# Patient Record
Sex: Female | Born: 1955 | Race: Black or African American | Hispanic: No | Marital: Single | Smoking: Never smoker
Health system: Southern US, Community
[De-identification: ages and names within clinical notes are randomized; demographics above are authoritative.]

## PROBLEM LIST (undated history)

## (undated) DIAGNOSIS — IMO0002 Reserved for concepts with insufficient information to code with codable children: Secondary | ICD-10-CM

## (undated) DIAGNOSIS — N72 Inflammatory disease of cervix uteri: Secondary | ICD-10-CM

## (undated) DIAGNOSIS — K635 Polyp of colon: Secondary | ICD-10-CM

## (undated) DIAGNOSIS — E039 Hypothyroidism, unspecified: Secondary | ICD-10-CM

## (undated) DIAGNOSIS — N809 Endometriosis, unspecified: Secondary | ICD-10-CM

## (undated) DIAGNOSIS — B9689 Other specified bacterial agents as the cause of diseases classified elsewhere: Secondary | ICD-10-CM

## (undated) DIAGNOSIS — H409 Unspecified glaucoma: Secondary | ICD-10-CM

## (undated) DIAGNOSIS — N760 Acute vaginitis: Secondary | ICD-10-CM

## (undated) DIAGNOSIS — R87619 Unspecified abnormal cytological findings in specimens from cervix uteri: Secondary | ICD-10-CM

## (undated) DIAGNOSIS — N762 Acute vulvitis: Secondary | ICD-10-CM

## (undated) DIAGNOSIS — T7840XA Allergy, unspecified, initial encounter: Secondary | ICD-10-CM

## (undated) DIAGNOSIS — I10 Essential (primary) hypertension: Secondary | ICD-10-CM

## (undated) DIAGNOSIS — D219 Benign neoplasm of connective and other soft tissue, unspecified: Secondary | ICD-10-CM

## (undated) DIAGNOSIS — D649 Anemia, unspecified: Secondary | ICD-10-CM

## (undated) HISTORY — DX: Unspecified abnormal cytological findings in specimens from cervix uteri: R87.619

## (undated) HISTORY — DX: Endometriosis, unspecified: N80.9

## (undated) HISTORY — DX: Essential (primary) hypertension: I10

## (undated) HISTORY — DX: Hypothyroidism, unspecified: E03.9

## (undated) HISTORY — PX: WISDOM TOOTH EXTRACTION: SHX21

## (undated) HISTORY — DX: Other specified bacterial agents as the cause of diseases classified elsewhere: B96.89

## (undated) HISTORY — DX: Anemia, unspecified: D64.9

## (undated) HISTORY — DX: Inflammatory disease of cervix uteri: N72

## (undated) HISTORY — DX: Unspecified glaucoma: H40.9

## (undated) HISTORY — DX: Reserved for concepts with insufficient information to code with codable children: IMO0002

## (undated) HISTORY — DX: Benign neoplasm of connective and other soft tissue, unspecified: D21.9

## (undated) HISTORY — DX: Polyp of colon: K63.5

## (undated) HISTORY — PX: LAPAROSCOPY: SHX197

## (undated) HISTORY — DX: Allergy, unspecified, initial encounter: T78.40XA

## (undated) HISTORY — PX: OTHER SURGICAL HISTORY: SHX169

## (undated) HISTORY — DX: Other specified bacterial agents as the cause of diseases classified elsewhere: N76.0

## (undated) HISTORY — DX: Acute vulvitis: N76.2

---

## 1987-05-27 HISTORY — PX: LAPAROSCOPY: SHX197

## 1997-10-25 ENCOUNTER — Ambulatory Visit (HOSPITAL_COMMUNITY): Admission: RE | Admit: 1997-10-25 | Discharge: 1997-10-25 | Payer: Self-pay | Admitting: Obstetrics and Gynecology

## 1997-12-20 ENCOUNTER — Other Ambulatory Visit: Admission: RE | Admit: 1997-12-20 | Discharge: 1997-12-20 | Payer: Self-pay | Admitting: Obstetrics & Gynecology

## 1998-05-26 HISTORY — PX: GLAUCOMA SURGERY: SHX656

## 1998-10-16 ENCOUNTER — Other Ambulatory Visit: Admission: RE | Admit: 1998-10-16 | Discharge: 1998-10-16 | Payer: Self-pay | Admitting: Obstetrics and Gynecology

## 1998-10-30 ENCOUNTER — Ambulatory Visit (HOSPITAL_COMMUNITY): Admission: RE | Admit: 1998-10-30 | Discharge: 1998-10-30 | Payer: Self-pay | Admitting: Obstetrics and Gynecology

## 1998-10-30 ENCOUNTER — Encounter: Payer: Self-pay | Admitting: Obstetrics and Gynecology

## 1998-11-14 ENCOUNTER — Ambulatory Visit (HOSPITAL_COMMUNITY): Admission: RE | Admit: 1998-11-14 | Discharge: 1998-11-14 | Payer: Self-pay | Admitting: Family Medicine

## 1998-12-25 ENCOUNTER — Ambulatory Visit (HOSPITAL_COMMUNITY): Admission: RE | Admit: 1998-12-25 | Discharge: 1998-12-25 | Payer: Self-pay | Admitting: Cardiology

## 1999-11-06 ENCOUNTER — Other Ambulatory Visit: Admission: RE | Admit: 1999-11-06 | Discharge: 1999-11-06 | Payer: Self-pay | Admitting: Obstetrics and Gynecology

## 1999-11-11 ENCOUNTER — Encounter: Payer: Self-pay | Admitting: Obstetrics and Gynecology

## 1999-11-11 ENCOUNTER — Ambulatory Visit (HOSPITAL_COMMUNITY): Admission: RE | Admit: 1999-11-11 | Discharge: 1999-11-11 | Payer: Self-pay | Admitting: Obstetrics and Gynecology

## 2000-03-03 ENCOUNTER — Encounter (INDEPENDENT_AMBULATORY_CARE_PROVIDER_SITE_OTHER): Payer: Self-pay | Admitting: Specialist

## 2000-03-03 ENCOUNTER — Other Ambulatory Visit: Admission: RE | Admit: 2000-03-03 | Discharge: 2000-03-03 | Payer: Self-pay | Admitting: Ophthalmology

## 2000-11-02 ENCOUNTER — Ambulatory Visit (HOSPITAL_COMMUNITY): Admission: RE | Admit: 2000-11-02 | Discharge: 2000-11-02 | Payer: Self-pay | Admitting: Obstetrics and Gynecology

## 2000-11-02 ENCOUNTER — Encounter: Payer: Self-pay | Admitting: Obstetrics and Gynecology

## 2000-12-08 ENCOUNTER — Encounter: Admission: RE | Admit: 2000-12-08 | Discharge: 2000-12-08 | Payer: Self-pay | Admitting: Family Medicine

## 2000-12-08 ENCOUNTER — Encounter: Payer: Self-pay | Admitting: Family Medicine

## 2000-12-28 ENCOUNTER — Other Ambulatory Visit: Admission: RE | Admit: 2000-12-28 | Discharge: 2000-12-28 | Payer: Self-pay | Admitting: Obstetrics and Gynecology

## 2001-11-03 ENCOUNTER — Ambulatory Visit (HOSPITAL_COMMUNITY): Admission: RE | Admit: 2001-11-03 | Discharge: 2001-11-03 | Payer: Self-pay | Admitting: Obstetrics and Gynecology

## 2001-11-03 ENCOUNTER — Encounter: Payer: Self-pay | Admitting: Obstetrics and Gynecology

## 2002-01-17 ENCOUNTER — Other Ambulatory Visit: Admission: RE | Admit: 2002-01-17 | Discharge: 2002-01-17 | Payer: Self-pay | Admitting: Obstetrics and Gynecology

## 2002-11-07 ENCOUNTER — Ambulatory Visit (HOSPITAL_COMMUNITY): Admission: RE | Admit: 2002-11-07 | Discharge: 2002-11-07 | Payer: Self-pay | Admitting: Obstetrics and Gynecology

## 2002-11-07 ENCOUNTER — Encounter: Payer: Self-pay | Admitting: Obstetrics and Gynecology

## 2003-01-05 ENCOUNTER — Other Ambulatory Visit: Admission: RE | Admit: 2003-01-05 | Discharge: 2003-01-05 | Payer: Self-pay | Admitting: Obstetrics and Gynecology

## 2003-11-29 ENCOUNTER — Ambulatory Visit (HOSPITAL_COMMUNITY): Admission: RE | Admit: 2003-11-29 | Discharge: 2003-11-29 | Payer: Self-pay | Admitting: Obstetrics and Gynecology

## 2004-02-12 ENCOUNTER — Other Ambulatory Visit: Admission: RE | Admit: 2004-02-12 | Discharge: 2004-02-12 | Payer: Self-pay | Admitting: Obstetrics and Gynecology

## 2004-02-21 ENCOUNTER — Ambulatory Visit (HOSPITAL_COMMUNITY): Admission: RE | Admit: 2004-02-21 | Discharge: 2004-02-21 | Payer: Self-pay | Admitting: Obstetrics and Gynecology

## 2004-03-29 ENCOUNTER — Encounter: Admission: RE | Admit: 2004-03-29 | Discharge: 2004-03-29 | Payer: Self-pay | Admitting: Obstetrics and Gynecology

## 2004-12-05 ENCOUNTER — Ambulatory Visit (HOSPITAL_COMMUNITY): Admission: RE | Admit: 2004-12-05 | Discharge: 2004-12-05 | Payer: Self-pay | Admitting: Obstetrics and Gynecology

## 2005-01-08 ENCOUNTER — Other Ambulatory Visit: Admission: RE | Admit: 2005-01-08 | Discharge: 2005-01-08 | Payer: Self-pay | Admitting: Obstetrics and Gynecology

## 2005-06-23 ENCOUNTER — Other Ambulatory Visit: Admission: RE | Admit: 2005-06-23 | Discharge: 2005-06-23 | Payer: Self-pay | Admitting: Obstetrics and Gynecology

## 2005-12-16 ENCOUNTER — Ambulatory Visit (HOSPITAL_COMMUNITY): Admission: RE | Admit: 2005-12-16 | Discharge: 2005-12-16 | Payer: Self-pay | Admitting: Obstetrics and Gynecology

## 2005-12-24 ENCOUNTER — Other Ambulatory Visit: Admission: RE | Admit: 2005-12-24 | Discharge: 2005-12-24 | Payer: Self-pay | Admitting: Obstetrics and Gynecology

## 2006-12-28 ENCOUNTER — Ambulatory Visit (HOSPITAL_COMMUNITY): Admission: RE | Admit: 2006-12-28 | Discharge: 2006-12-28 | Payer: Self-pay | Admitting: Obstetrics and Gynecology

## 2008-01-07 ENCOUNTER — Ambulatory Visit (HOSPITAL_COMMUNITY): Admission: RE | Admit: 2008-01-07 | Discharge: 2008-01-07 | Payer: Self-pay | Admitting: Obstetrics and Gynecology

## 2009-01-08 ENCOUNTER — Encounter (HOSPITAL_COMMUNITY): Admission: RE | Admit: 2009-01-08 | Discharge: 2009-03-30 | Payer: Self-pay | Admitting: Family Medicine

## 2009-02-06 ENCOUNTER — Ambulatory Visit (HOSPITAL_COMMUNITY): Admission: RE | Admit: 2009-02-06 | Discharge: 2009-02-06 | Payer: Self-pay | Admitting: Family Medicine

## 2009-12-17 ENCOUNTER — Encounter (HOSPITAL_COMMUNITY): Admission: RE | Admit: 2009-12-17 | Discharge: 2010-02-20 | Payer: Self-pay | Admitting: Endocrinology

## 2009-12-25 ENCOUNTER — Ambulatory Visit (HOSPITAL_COMMUNITY): Admission: RE | Admit: 2009-12-25 | Discharge: 2009-12-25 | Payer: Self-pay | Admitting: Endocrinology

## 2010-03-07 ENCOUNTER — Ambulatory Visit (HOSPITAL_COMMUNITY): Admission: RE | Admit: 2010-03-07 | Discharge: 2010-03-07 | Payer: Self-pay | Admitting: Obstetrics and Gynecology

## 2010-05-26 HISTORY — PX: WISDOM TOOTH EXTRACTION: SHX21

## 2010-06-16 ENCOUNTER — Encounter: Payer: Self-pay | Admitting: Obstetrics and Gynecology

## 2010-06-16 ENCOUNTER — Encounter: Payer: Self-pay | Admitting: Endocrinology

## 2010-06-17 ENCOUNTER — Encounter: Payer: Self-pay | Admitting: Family Medicine

## 2011-03-06 ENCOUNTER — Other Ambulatory Visit (HOSPITAL_COMMUNITY): Payer: Self-pay | Admitting: Obstetrics and Gynecology

## 2011-03-06 DIAGNOSIS — Z1231 Encounter for screening mammogram for malignant neoplasm of breast: Secondary | ICD-10-CM

## 2011-03-20 ENCOUNTER — Ambulatory Visit (HOSPITAL_COMMUNITY)
Admission: RE | Admit: 2011-03-20 | Discharge: 2011-03-20 | Disposition: A | Discharge status: Home / Self Care | Payer: Managed Care, Other (non HMO) | Source: Ambulatory Visit | Attending: Obstetrics and Gynecology | Admitting: Obstetrics and Gynecology

## 2011-03-20 DIAGNOSIS — Z1231 Encounter for screening mammogram for malignant neoplasm of breast: Secondary | ICD-10-CM

## 2011-12-26 DIAGNOSIS — N809 Endometriosis, unspecified: Secondary | ICD-10-CM | POA: Insufficient documentation

## 2011-12-26 DIAGNOSIS — IMO0002 Reserved for concepts with insufficient information to code with codable children: Secondary | ICD-10-CM | POA: Insufficient documentation

## 2011-12-26 DIAGNOSIS — E039 Hypothyroidism, unspecified: Secondary | ICD-10-CM | POA: Insufficient documentation

## 2011-12-26 DIAGNOSIS — I10 Essential (primary) hypertension: Secondary | ICD-10-CM | POA: Insufficient documentation

## 2011-12-26 DIAGNOSIS — D649 Anemia, unspecified: Secondary | ICD-10-CM

## 2011-12-26 DIAGNOSIS — B9689 Other specified bacterial agents as the cause of diseases classified elsewhere: Secondary | ICD-10-CM

## 2011-12-26 DIAGNOSIS — N762 Acute vulvitis: Secondary | ICD-10-CM | POA: Insufficient documentation

## 2011-12-26 DIAGNOSIS — H409 Unspecified glaucoma: Secondary | ICD-10-CM

## 2011-12-26 DIAGNOSIS — N760 Acute vaginitis: Secondary | ICD-10-CM

## 2011-12-26 DIAGNOSIS — K635 Polyp of colon: Secondary | ICD-10-CM

## 2011-12-26 DIAGNOSIS — D219 Benign neoplasm of connective and other soft tissue, unspecified: Secondary | ICD-10-CM | POA: Insufficient documentation

## 2011-12-26 DIAGNOSIS — N72 Inflammatory disease of cervix uteri: Secondary | ICD-10-CM | POA: Insufficient documentation

## 2011-12-28 IMAGING — NM NM RAI THERAPY FOR HYPERTHYROIDISM
1 series · 1 of 1 positions shown · non-contrast
Comparison: Thyroid uptake and scan 12/18/2009, 01/09/2009

CLINICAL DATA: Graves' disease.  Second treatment or residual
thyroid toxicosis.  Initial therapy of 15 mCi on 01/09/2009.

NUCLEAR MEDICINE RADIOACTIVE IODINE THERAPY FOR HYPERTHYROIDISM
TECHNIQUE: The risks and benefits of radioactive iodine therapy
were discussed with the patient in detail. Alternative therapies
were also mentioned. Radiation safety was discussed with the
patient, including how to protect the general public from exposure.
There were no barriers to communication.  Written consent was
obtained.  The patient then received a capsule containing the
radiopharmaceutical.  The patient will follow-up with the referring
physician.
Radiopharmaceutical: 25.8 mCi I 131

[Series 1: st static · 2.37mm/px · 1 of 1 slices shown]
[im 1/1]
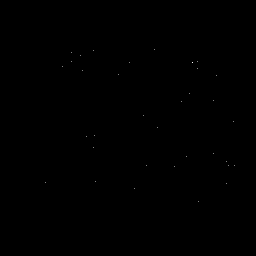

[1 of 1 positions shown; findings below may reference images not displayed]

FINDINGS: Imaging findings and lab values consistent with Graves'
disease.
IMPRESSION: Per oral  I 131 therapy for Graves' disease.

## 2011-12-30 ENCOUNTER — Ambulatory Visit: Payer: Self-pay | Admitting: Obstetrics and Gynecology

## 2012-02-12 ENCOUNTER — Other Ambulatory Visit: Payer: Self-pay | Admitting: Obstetrics and Gynecology

## 2012-02-12 DIAGNOSIS — Z1231 Encounter for screening mammogram for malignant neoplasm of breast: Secondary | ICD-10-CM

## 2012-03-23 ENCOUNTER — Ambulatory Visit (HOSPITAL_COMMUNITY): Payer: Managed Care, Other (non HMO)

## 2012-04-06 ENCOUNTER — Ambulatory Visit (HOSPITAL_COMMUNITY)
Admission: RE | Admit: 2012-04-06 | Discharge: 2012-04-06 | Disposition: A | Discharge status: Home / Self Care | Payer: Managed Care, Other (non HMO) | Source: Ambulatory Visit | Attending: Obstetrics and Gynecology | Admitting: Obstetrics and Gynecology

## 2012-04-06 DIAGNOSIS — Z1231 Encounter for screening mammogram for malignant neoplasm of breast: Secondary | ICD-10-CM | POA: Insufficient documentation

## 2012-06-24 ENCOUNTER — Ambulatory Visit: Payer: BC Managed Care – PPO | Admitting: Obstetrics and Gynecology

## 2012-06-24 ENCOUNTER — Encounter: Payer: Self-pay | Admitting: Obstetrics and Gynecology

## 2012-06-24 VITALS — BP 122/82 | Ht 68.0 in | Wt 225.0 lb

## 2012-06-24 DIAGNOSIS — D649 Anemia, unspecified: Secondary | ICD-10-CM

## 2012-06-24 DIAGNOSIS — D219 Benign neoplasm of connective and other soft tissue, unspecified: Secondary | ICD-10-CM

## 2012-06-24 DIAGNOSIS — N72 Inflammatory disease of cervix uteri: Secondary | ICD-10-CM

## 2012-06-24 DIAGNOSIS — Z124 Encounter for screening for malignant neoplasm of cervix: Secondary | ICD-10-CM

## 2012-06-24 NOTE — Progress Notes (Signed)
AEX Every  Last Pap: 02/2011 WNL: No Regular Periods:no Contraception: pm  Monthly Breast exam:yes Tetanus<46yrs:yes Nl.Bladder Function:yes Daily BMs:yes Healthy Diet:yes Calcium:yes Mammogram:yes Date of Mammogram: 03/2012 Exercise:yes Have often Exercise: 2 times a week  Seatbelt: yes Abuse at home: no Stressful work:no Sigmoid-colonoscopy: 04/2008 Bone Density: Yes at work  PCP: Dr.Sanders  Change in PMH: unchanged  Change in ZOX:[id number]  Pt stated no issues today.  Subjective:    Julia Richards is a 57 y.o. female G0P0000 who presents for annual exam.  The patient has no complaints today. She had a long history of fibroids, and presumed endometriosis but has no symptoms referable to either of those at this time. She has had no vaginal bleeding.  The following portions of the patient's history were reviewed and updated as appropriate: allergies, current medications, past family history, past medical history, past social history, past surgical history and problem list.  Review of Systems Pertinent items are noted in HPI. Gastrointestinal:No change in bowel habits, no abdominal pain, no rectal bleeding Genitourinary:negative for dysuria, frequency, hematuria, nocturia and urinary incontinence    Objective:     BP 122/82  Ht 5\' 8"  (1.727 m)  Wt 225 lb (102.059 kg)  BMI 34.21 kg/m2  Weight:  Wt Readings from Last 1 Encounters:  06/24/12 225 lb (102.059 kg)     BMI: Body mass index is 34.21 kg/(m^2). General Appearance: Alert, appropriate appearance for age. No acute distress HEENT: Grossly normal Neck / Thyroid: Supple, no masses, nodes or enlargement Lungs: clear to auscultation bilaterally Back: No CVA tenderness Breast Exam: No masses or nodes.No dimpling, nipple retraction or discharge. Cardiovascular: Regular rate and rhythm. S1, S2, no murmur Gastrointestinal: Soft, non-tender, no masses or organomegaly Pelvic Exam: External genitalia: Spotting  hypopigmentation surrounding fold. This has been stable. Over many years. Vaginal: atrophic mucosa Cervix: normal appearance and Exocervical contact bleeding Adnexa: No masses, or tenderness Uterus: Does not feel enlarged, but he is fairly immobile, and he has been for many years Rectovaginal: normal rectal, no masses Lymphatic Exam: Non-palpable nodes in neck, clavicular, axillary, or inguinal regions Skin: no rash or abnormalities Neurologic: Normal gait and speech, no tremor  Psychiatric: Alert and oriented, appropriate affect.    Urinalysis:Not done    Assessment:    Asymptomatic uterine fibroids  Long history of presumed endometriosis with past history of ureteral obstruction treated conservatively with ureteral, no evidence of recurrent obstruction. Stable hypopigmented vulvar lesion, most consistent with lichen sclerosus which is asymptomatic   Plan:   mammogram pap smear return annually or prn

## 2012-06-25 LAB — PAP IG AND HPV HIGH-RISK: HPV DNA High Risk: NOT DETECTED

## 2012-06-28 ENCOUNTER — Encounter: Payer: Self-pay | Admitting: Obstetrics and Gynecology

## 2013-03-15 ENCOUNTER — Other Ambulatory Visit (HOSPITAL_COMMUNITY): Payer: Self-pay | Admitting: Internal Medicine

## 2013-03-15 DIAGNOSIS — Z1231 Encounter for screening mammogram for malignant neoplasm of breast: Secondary | ICD-10-CM

## 2013-04-11 ENCOUNTER — Ambulatory Visit (HOSPITAL_COMMUNITY)
Admission: RE | Admit: 2013-04-11 | Discharge: 2013-04-11 | Disposition: A | Discharge status: Home / Self Care | Payer: BC Managed Care – PPO | Source: Ambulatory Visit | Attending: Internal Medicine | Admitting: Internal Medicine

## 2013-04-11 DIAGNOSIS — Z1231 Encounter for screening mammogram for malignant neoplasm of breast: Secondary | ICD-10-CM | POA: Insufficient documentation

## 2014-03-14 ENCOUNTER — Other Ambulatory Visit (HOSPITAL_COMMUNITY): Payer: Self-pay | Admitting: Obstetrics and Gynecology

## 2014-03-14 DIAGNOSIS — Z1231 Encounter for screening mammogram for malignant neoplasm of breast: Secondary | ICD-10-CM

## 2014-04-13 ENCOUNTER — Ambulatory Visit (HOSPITAL_COMMUNITY)
Admission: RE | Admit: 2014-04-13 | Discharge: 2014-04-13 | Disposition: A | Discharge status: Home / Self Care | Payer: BC Managed Care – PPO | Source: Ambulatory Visit | Attending: Obstetrics and Gynecology | Admitting: Obstetrics and Gynecology

## 2014-04-13 DIAGNOSIS — Z1231 Encounter for screening mammogram for malignant neoplasm of breast: Secondary | ICD-10-CM | POA: Insufficient documentation

## 2015-03-28 ENCOUNTER — Other Ambulatory Visit: Payer: Self-pay

## 2015-03-28 DIAGNOSIS — Z1231 Encounter for screening mammogram for malignant neoplasm of breast: Secondary | ICD-10-CM

## 2015-05-01 ENCOUNTER — Ambulatory Visit
Admission: RE | Admit: 2015-05-01 | Discharge: 2015-05-01 | Disposition: A | Discharge status: Home / Self Care | Payer: BLUE CROSS/BLUE SHIELD | Source: Ambulatory Visit

## 2015-05-01 DIAGNOSIS — Z1231 Encounter for screening mammogram for malignant neoplasm of breast: Secondary | ICD-10-CM

## 2016-06-10 DIAGNOSIS — Z1231 Encounter for screening mammogram for malignant neoplasm of breast: Secondary | ICD-10-CM | POA: Diagnosis not present

## 2016-06-16 DIAGNOSIS — H401133 Primary open-angle glaucoma, bilateral, severe stage: Secondary | ICD-10-CM | POA: Diagnosis not present

## 2016-08-11 DIAGNOSIS — H401133 Primary open-angle glaucoma, bilateral, severe stage: Secondary | ICD-10-CM | POA: Diagnosis not present

## 2016-09-12 DIAGNOSIS — Z124 Encounter for screening for malignant neoplasm of cervix: Secondary | ICD-10-CM | POA: Diagnosis not present

## 2016-09-12 DIAGNOSIS — Z01411 Encounter for gynecological examination (general) (routine) with abnormal findings: Secondary | ICD-10-CM | POA: Diagnosis not present

## 2016-11-03 DIAGNOSIS — H401133 Primary open-angle glaucoma, bilateral, severe stage: Secondary | ICD-10-CM | POA: Diagnosis not present

## 2016-12-08 DIAGNOSIS — H401133 Primary open-angle glaucoma, bilateral, severe stage: Secondary | ICD-10-CM | POA: Diagnosis not present

## 2017-02-09 DIAGNOSIS — Z23 Encounter for immunization: Secondary | ICD-10-CM | POA: Diagnosis not present

## 2017-05-28 DIAGNOSIS — E890 Postprocedural hypothyroidism: Secondary | ICD-10-CM | POA: Diagnosis not present

## 2017-06-02 DIAGNOSIS — E890 Postprocedural hypothyroidism: Secondary | ICD-10-CM | POA: Diagnosis not present

## 2017-06-02 DIAGNOSIS — I10 Essential (primary) hypertension: Secondary | ICD-10-CM | POA: Diagnosis not present

## 2017-06-09 DIAGNOSIS — N390 Urinary tract infection, site not specified: Secondary | ICD-10-CM | POA: Diagnosis not present

## 2017-06-09 DIAGNOSIS — Z0000 Encounter for general adult medical examination without abnormal findings: Secondary | ICD-10-CM | POA: Diagnosis not present

## 2017-06-09 DIAGNOSIS — D519 Vitamin B12 deficiency anemia, unspecified: Secondary | ICD-10-CM | POA: Diagnosis not present

## 2017-06-09 DIAGNOSIS — I10 Essential (primary) hypertension: Secondary | ICD-10-CM | POA: Diagnosis not present

## 2017-09-15 DIAGNOSIS — N809 Endometriosis, unspecified: Secondary | ICD-10-CM | POA: Diagnosis not present

## 2017-09-15 DIAGNOSIS — L900 Lichen sclerosus et atrophicus: Secondary | ICD-10-CM | POA: Diagnosis not present

## 2017-09-15 DIAGNOSIS — Z01411 Encounter for gynecological examination (general) (routine) with abnormal findings: Secondary | ICD-10-CM | POA: Diagnosis not present

## 2017-10-09 DIAGNOSIS — Z1231 Encounter for screening mammogram for malignant neoplasm of breast: Secondary | ICD-10-CM | POA: Diagnosis not present

## 2017-11-19 DIAGNOSIS — H401133 Primary open-angle glaucoma, bilateral, severe stage: Secondary | ICD-10-CM | POA: Diagnosis not present

## 2017-12-07 DIAGNOSIS — D519 Vitamin B12 deficiency anemia, unspecified: Secondary | ICD-10-CM | POA: Diagnosis not present

## 2017-12-07 DIAGNOSIS — R7309 Other abnormal glucose: Secondary | ICD-10-CM | POA: Diagnosis not present

## 2017-12-07 DIAGNOSIS — I10 Essential (primary) hypertension: Secondary | ICD-10-CM | POA: Diagnosis not present

## 2018-01-01 DIAGNOSIS — H401133 Primary open-angle glaucoma, bilateral, severe stage: Secondary | ICD-10-CM | POA: Diagnosis not present

## 2018-01-04 DIAGNOSIS — Z0000 Encounter for general adult medical examination without abnormal findings: Secondary | ICD-10-CM | POA: Insufficient documentation

## 2018-01-04 DIAGNOSIS — E890 Postprocedural hypothyroidism: Secondary | ICD-10-CM | POA: Diagnosis not present

## 2018-01-04 DIAGNOSIS — Z79899 Other long term (current) drug therapy: Secondary | ICD-10-CM | POA: Insufficient documentation

## 2018-01-04 DIAGNOSIS — E559 Vitamin D deficiency, unspecified: Secondary | ICD-10-CM | POA: Insufficient documentation

## 2018-01-04 DIAGNOSIS — I10 Essential (primary) hypertension: Secondary | ICD-10-CM | POA: Diagnosis not present

## 2018-01-04 DIAGNOSIS — D519 Vitamin B12 deficiency anemia, unspecified: Secondary | ICD-10-CM | POA: Diagnosis not present

## 2018-01-04 LAB — BASIC METABOLIC PANEL
BUN: 13 (ref 4–21)
Creatinine: 0.9 (ref <=1.1)
Glucose: 94
Potassium: 3.6 (ref 3.4–5.3)
Sodium: 139 (ref 137–147)

## 2018-01-04 LAB — TSH: TSH: 0.77 (ref <=5.90)

## 2018-02-06 ENCOUNTER — Encounter: Payer: Self-pay | Admitting: Internal Medicine

## 2018-02-06 DIAGNOSIS — Z79899 Other long term (current) drug therapy: Secondary | ICD-10-CM

## 2018-02-06 DIAGNOSIS — Z23 Encounter for immunization: Secondary | ICD-10-CM | POA: Diagnosis not present

## 2018-02-06 DIAGNOSIS — E559 Vitamin D deficiency, unspecified: Secondary | ICD-10-CM

## 2018-03-01 ENCOUNTER — Telehealth: Payer: Self-pay

## 2018-03-01 NOTE — Telephone Encounter (Signed)
Returned the pt's call and left a message that I was returning her call to schedule her an appt.

## 2018-03-05 ENCOUNTER — Other Ambulatory Visit: Payer: Self-pay

## 2018-03-05 ENCOUNTER — Ambulatory Visit: Payer: Self-pay

## 2018-03-05 DIAGNOSIS — D519 Vitamin B12 deficiency anemia, unspecified: Secondary | ICD-10-CM

## 2018-03-05 MED ORDER — CYANOCOBALAMIN 1000 MCG/ML IJ SOLN
1000.0000 ug | Freq: Once | INTRAMUSCULAR | Status: AC
  Administered 2018-03-05: 1000 ug via INTRAMUSCULAR

## 2018-03-05 NOTE — Progress Notes (Signed)
Pt came in today for a vitamin B12 injection.

## 2018-04-12 DIAGNOSIS — H401133 Primary open-angle glaucoma, bilateral, severe stage: Secondary | ICD-10-CM | POA: Diagnosis not present

## 2018-04-20 ENCOUNTER — Ambulatory Visit: Payer: 59

## 2018-06-01 ENCOUNTER — Ambulatory Visit: Payer: 59 | Admitting: Internal Medicine

## 2018-06-26 ENCOUNTER — Other Ambulatory Visit: Payer: Self-pay | Admitting: Internal Medicine

## 2018-07-05 ENCOUNTER — Encounter: Payer: 59 | Admitting: Internal Medicine

## 2018-07-27 ENCOUNTER — Ambulatory Visit: Payer: 59 | Admitting: Internal Medicine

## 2018-07-27 ENCOUNTER — Encounter: Payer: Self-pay | Admitting: Internal Medicine

## 2018-07-27 VITALS — BP 112/78 | HR 78 | Temp 98.0°F | Ht 66.4 in | Wt 206.4 lb

## 2018-07-27 DIAGNOSIS — Z1211 Encounter for screening for malignant neoplasm of colon: Secondary | ICD-10-CM

## 2018-07-27 DIAGNOSIS — Z860100 Personal history of colon polyps, unspecified: Secondary | ICD-10-CM | POA: Insufficient documentation

## 2018-07-27 DIAGNOSIS — Z0000 Encounter for general adult medical examination without abnormal findings: Secondary | ICD-10-CM | POA: Diagnosis not present

## 2018-07-27 DIAGNOSIS — I10 Essential (primary) hypertension: Secondary | ICD-10-CM | POA: Diagnosis not present

## 2018-07-27 LAB — POCT URINALYSIS DIPSTICK
Bilirubin, UA: NEGATIVE
Blood, UA: NEGATIVE
Glucose, UA: NEGATIVE
Ketones, UA: NEGATIVE
Nitrite, UA: NEGATIVE
Protein, UA: NEGATIVE
Spec Grav, UA: 1.02 (ref 1.010–1.025)
Urobilinogen, UA: 1 E.U./dL
pH, UA: 7 (ref 5.0–8.0)

## 2018-07-27 LAB — POCT UA - MICROALBUMIN: Microalbumin Ur, POC: NORMAL mg/L

## 2018-07-27 NOTE — Progress Notes (Signed)
Subjective:     Patient ID: Julia Richards , female    DOB: January 02, 1956 , 64 y.o.   MRN: [id number]   Chief Complaint  Patient presents with  . Annual Exam  . Hypertension    HPI  She is here today for a full physical exam. She has seen Dr. Leo Grosser in the past for her gyn exams. She is sad to hear she has retired. She wants to know who to see next for her GYN exams. She has no specific concerns or complaints at this time.   Hypertension  This is a chronic problem. The current episode started more than 1 year ago. The problem has been gradually improving since onset. The problem is controlled. Pertinent negatives include no blurred vision, chest pain, palpitations or shortness of breath. Risk factors for coronary artery disease include obesity, sedentary lifestyle and post-menopausal state.     Past Medical History:  Diagnosis Date  . Abnormal pap   . Allergy   . Anemia   . Bacterial vaginosis   . Cervicitis   . Colonic polyp   . Endometriosis   . Fibroids   . Glaucoma   . Hypertension   . Hypothyroidism   . Vulvitis      Family History  Problem Relation Age of Onset  . Hypertension Mother   . Cancer Mother        stomach  . Kidney disease Father   . Diabetes Maternal Aunt   . Diabetes Maternal Uncle   . Hypertension Sister   . Stroke Maternal Uncle      Current Outpatient Medications:  .  bimatoprost (LUMIGAN) 0.01 % SOLN, 1 drop at bedtime., Disp: , Rfl:  .  calcium carbonate (TUMS EX) 750 MG chewable tablet, Chew 1 tablet by mouth daily., Disp: , Rfl:  .  Cholecalciferol (VITAMIN D3) 125 MCG (5000 UT) TBDP, Take by mouth., Disp: , Rfl:  .  dorzolamide-timolol (COSOPT) 22.3-6.8 MG/ML ophthalmic solution, 1 drop 2 (two) times daily., Disp: , Rfl:  .  levothyroxine (SYNTHROID, LEVOTHROID) 88 MCG tablet, Take 88 mcg by mouth daily., Disp: , Rfl:  .  Netarsudil Dimesylate (RHOPRESSA) 0.02 % SOLN, Apply to eye., Disp: , Rfl:  .  olmesartan-hydrochlorothiazide  (BENICAR HCT) 40-12.5 MG tablet, TAKE 1 TABLET BY MOUTH EVERY DAY, Disp: 90 tablet, Rfl: 1 .  pilocarpine (PILOCAR) 4 % ophthalmic solution, 1 drop 4 (four) times daily., Disp: , Rfl:    Allergies  Allergen Reactions  . Amoxicillin   . Codeine Itching  . Penicillins Hives and Itching  . Tetracyclines & Related       No LMP recorded. Patient is postmenopausal..  Negative for: breast discharge, breast lump(s), breast pain and breast self exam. Associated symptoms include abnormal vaginal bleeding. Pertinent negatives include abnormal bleeding (hematology), anxiety, decreased libido, depression, difficulty falling sleep, dyspareunia, history of infertility, nocturia, sexual dysfunction, sleep disturbances, urinary incontinence, urinary urgency, vaginal discharge and vaginal itching. Diet regular.The patient states her exercise level is   minimal.   . The patient's tobacco use is:  Social History   Tobacco Use  Smoking Status Never Smoker  Smokeless Tobacco Never Used  . She has been exposed to passive smoke. The patient's alcohol use is:  Social History   Substance and Sexual Activity  Alcohol Use Yes  . Additional information: Last pap 08/2017. Marland Kitchen   Review of Systems  Constitutional: Negative.   HENT: Negative.   Eyes: Negative.  Negative for blurred vision.  Respiratory: Negative.  Negative for shortness of breath.   Cardiovascular: Negative.  Negative for chest pain and palpitations.  Gastrointestinal: Negative.   Endocrine: Negative.   Genitourinary: Negative.   Musculoskeletal: Negative.   Skin: Negative.   Allergic/Immunologic: Negative.   Neurological: Negative.   Hematological: Negative.   Psychiatric/Behavioral: Negative.      Today's Vitals   07/27/18 1413  BP: 112/78  Pulse: 78  Temp: 98 F (36.7 C)  TempSrc: Oral  Weight: 206 lb 6.4 oz (93.6 kg)  Height: 5' 6.4" (1.687 m)   Body mass index is 32.91 kg/m.   Objective:  Physical Exam Vitals signs and  nursing note reviewed.  Constitutional:      Appearance: Normal appearance. She is obese.  HENT:     Head: Normocephalic and atraumatic.     Right Ear: Tympanic membrane, ear canal and external ear normal.     Left Ear: Tympanic membrane, ear canal and external ear normal.     Nose: Nose normal.     Mouth/Throat:     Mouth: Mucous membranes are moist.     Pharynx: Oropharynx is clear.  Eyes:     Extraocular Movements: Extraocular movements intact.     Conjunctiva/sclera: Conjunctivae normal.     Pupils: Pupils are equal, round, and reactive to light.  Neck:     Musculoskeletal: Normal range of motion and neck supple.  Cardiovascular:     Rate and Rhythm: Normal rate and regular rhythm.     Pulses: Normal pulses.     Heart sounds: Normal heart sounds.  Pulmonary:     Effort: Pulmonary effort is normal.     Breath sounds: Normal breath sounds.  Chest:     Breasts:        Right: Inverted nipple present. No swelling, bleeding, mass or nipple discharge.        Left: Inverted nipple present. No swelling, bleeding, mass or nipple discharge.  Abdominal:     General: Abdomen is flat. Bowel sounds are normal.     Palpations: Abdomen is soft.  Genitourinary:    Comments: deferred Musculoskeletal: Normal range of motion.  Skin:    General: Skin is warm and dry.  Neurological:     General: No focal deficit present.     Mental Status: She is alert and oriented to person, place, and time.  Psychiatric:        Mood and Affect: Mood normal.        Behavior: Behavior normal.         Assessment And Plan:     1. Routine general medical examination at health care facility  A full exam was performed. Importance of monthly self breast exams was discussed with the patient.  PATIENT HAS BEEN ADVISED TO GET 30-45 MINUTES REGULAR EXERCISE NO LESS THAN FOUR TO FIVE DAYS PER WEEK - BOTH WEIGHTBEARING EXERCISES AND AEROBIC ARE RECOMMENDED.  SHE IS ADVISED TO FOLLOW A HEALTHY DIET WITH AT LEAST  SIX FRUITS/VEGGIES PER DAY, DECREASE INTAKE OF RED MEAT, AND TO INCREASE FISH INTAKE TO TWO DAYS PER WEEK.  MEATS/FISH SHOULD NOT BE FRIED, BAKED OR BROILED IS PREFERABLE.  I SUGGEST WEARING SPF 50 SUNSCREEN ON EXPOSED PARTS AND ESPECIALLY WHEN IN THE DIRECT SUNLIGHT FOR AN EXTENDED PERIOD OF TIME.  PLEASE AVOID FAST FOOD RESTAURANTS AND INCREASE YOUR WATER INTAKE.  - CMP14+EGFR - CBC - Lipid panel - Hemoglobin A1c - Vitamin B12 - Hepatitis C antibody - TSH - T4, Free - HIV  antibody (with reflex) - Ambulatory referral to Gynecology  2. Essential hypertension  Well controlled. She will continue with current meds. She is encouraged to avoid adding salt to her foods. EKG performed, no acute changes noted. She will rto in six months for re-evaluation.   - EKG 12-Lead  3. Screen for colon cancer  Please see below.   4. Personal history of colonic polyps  I will refer her to GI for CRC screening. She has h/o polyps. Last one five years ago.   Maximino Greenland, MD

## 2018-07-27 NOTE — Patient Instructions (Signed)

## 2018-07-28 LAB — CMP14+EGFR
ALT: 10 IU/L (ref 0–32)
AST: 18 IU/L (ref 0–40)
Albumin/Globulin Ratio: 1.1 — ABNORMAL LOW (ref 1.2–2.2)
Albumin: 4.2 g/dL (ref 3.8–4.8)
Alkaline Phosphatase: 76 IU/L (ref 39–117)
BUN/Creatinine Ratio: 20 (ref 12–28)
BUN: 20 mg/dL (ref 8–27)
Bilirubin Total: <0.2 mg/dL (ref 0.0–1.2)
CO2: 28 mmol/L (ref 20–29)
Calcium: 9.3 mg/dL (ref 8.7–10.3)
Chloride: 102 mmol/L (ref 96–106)
Creatinine, Ser: 0.98 mg/dL (ref 0.57–1.00)
GFR calc Af Amer: 72 mL/min/{1.73_m2} (ref >59)
GFR calc non Af Amer: 62 mL/min/{1.73_m2} (ref >59)
Globulin, Total: 3.9 g/dL (ref 1.5–4.5)
Glucose: 84 mg/dL (ref 65–99)
Potassium: 4.3 mmol/L (ref 3.5–5.2)
Sodium: 142 mmol/L (ref 134–144)
Total Protein: 8.1 g/dL (ref 6.0–8.5)

## 2018-07-28 LAB — HEPATITIS C ANTIBODY: Hep C Virus Ab: 0.1 s/co ratio (ref 0.0–0.9)

## 2018-07-28 LAB — HEMOGLOBIN A1C
Est. average glucose Bld gHb Est-mCnc: 126 mg/dL
Hgb A1c MFr Bld: 6 % — ABNORMAL HIGH (ref 4.8–5.6)

## 2018-07-28 LAB — LIPID PANEL
Chol/HDL Ratio: 3.2 ratio (ref 0.0–4.4)
Cholesterol, Total: 189 mg/dL (ref 100–199)
HDL: 60 mg/dL (ref >39)
LDL Calculated: 111 mg/dL — ABNORMAL HIGH (ref 0–99)
Triglycerides: 91 mg/dL (ref 0–149)
VLDL Cholesterol Cal: 18 mg/dL (ref 5–40)

## 2018-07-28 LAB — CBC
Hematocrit: 36.8 % (ref 34.0–46.6)
Hemoglobin: 12.4 g/dL (ref 11.1–15.9)
MCH: 29.5 pg (ref 26.6–33.0)
MCHC: 33.7 g/dL (ref 31.5–35.7)
MCV: 87 fL (ref 79–97)
Platelets: 222 10*3/uL (ref 150–450)
RBC: 4.21 x10E6/uL (ref 3.77–5.28)
RDW: 12.7 % (ref 11.7–15.4)
WBC: 5.1 10*3/uL (ref 3.4–10.8)

## 2018-07-28 LAB — TSH: TSH: 1.24 u[IU]/mL (ref 0.450–4.500)

## 2018-07-28 LAB — VITAMIN B12: Vitamin B-12: 510 pg/mL (ref 232–1245)

## 2018-07-28 LAB — T4, FREE: Free T4: 1.29 ng/dL (ref 0.82–1.77)

## 2018-07-28 LAB — HIV ANTIBODY (ROUTINE TESTING W REFLEX): HIV Screen 4th Generation wRfx: NONREACTIVE

## 2018-09-09 ENCOUNTER — Other Ambulatory Visit: Payer: Self-pay

## 2018-09-09 ENCOUNTER — Ambulatory Visit: Payer: 59

## 2018-10-26 ENCOUNTER — Telehealth: Payer: Self-pay

## 2018-10-27 NOTE — Telephone Encounter (Signed)
The pt was notified that her physician results form has been faxed.

## 2018-11-01 LAB — HM PAP SMEAR: HM Pap smear: NORMAL

## 2018-12-23 ENCOUNTER — Other Ambulatory Visit: Payer: Self-pay | Admitting: Internal Medicine

## 2019-01-25 ENCOUNTER — Ambulatory Visit: Payer: 59 | Admitting: Internal Medicine

## 2019-01-25 ENCOUNTER — Encounter: Payer: Self-pay | Admitting: Internal Medicine

## 2019-01-25 ENCOUNTER — Other Ambulatory Visit: Payer: Self-pay

## 2019-01-25 VITALS — BP 122/78 | HR 105 | Temp 98.4°F | Ht 66.4 in | Wt 216.0 lb

## 2019-01-25 DIAGNOSIS — Z6834 Body mass index (BMI) 34.0-34.9, adult: Secondary | ICD-10-CM

## 2019-01-25 DIAGNOSIS — R7309 Other abnormal glucose: Secondary | ICD-10-CM | POA: Diagnosis not present

## 2019-01-25 DIAGNOSIS — E6609 Other obesity due to excess calories: Secondary | ICD-10-CM

## 2019-01-25 DIAGNOSIS — Z23 Encounter for immunization: Secondary | ICD-10-CM

## 2019-01-25 DIAGNOSIS — E039 Hypothyroidism, unspecified: Secondary | ICD-10-CM | POA: Diagnosis not present

## 2019-01-25 DIAGNOSIS — E538 Deficiency of other specified B group vitamins: Secondary | ICD-10-CM | POA: Diagnosis not present

## 2019-01-25 DIAGNOSIS — I10 Essential (primary) hypertension: Secondary | ICD-10-CM | POA: Diagnosis not present

## 2019-01-25 MED ORDER — CYANOCOBALAMIN 1000 MCG/ML IJ SOLN
1000.0000 ug | Freq: Once | INTRAMUSCULAR | Status: AC
  Administered 2019-01-25: 1000 ug via INTRAMUSCULAR

## 2019-01-26 LAB — BMP8+EGFR
BUN/Creatinine Ratio: 17 (ref 12–28)
BUN: 14 mg/dL (ref 8–27)
CO2: 25 mmol/L (ref 20–29)
Calcium: 9.1 mg/dL (ref 8.7–10.3)
Chloride: 101 mmol/L (ref 96–106)
Creatinine, Ser: 0.84 mg/dL (ref 0.57–1.00)
GFR calc Af Amer: 87 mL/min/{1.73_m2} (ref >59)
GFR calc non Af Amer: 75 mL/min/{1.73_m2} (ref >59)
Glucose: 88 mg/dL (ref 65–99)
Potassium: 3.5 mmol/L (ref 3.5–5.2)
Sodium: 138 mmol/L (ref 134–144)

## 2019-01-26 LAB — HEMOGLOBIN A1C
Est. average glucose Bld gHb Est-mCnc: 120 mg/dL
Hgb A1c MFr Bld: 5.8 % — ABNORMAL HIGH (ref 4.8–5.6)

## 2019-01-26 LAB — VITAMIN B12: Vitamin B-12: 378 pg/mL (ref 232–1245)

## 2019-01-26 LAB — TSH: TSH: 0.881 u[IU]/mL (ref 0.450–4.500)

## 2019-02-06 NOTE — Progress Notes (Signed)
Subjective:     Patient ID: Julia Richards , female    DOB: 1956-01-25 , 63 y.o.   MRN: [id number]   Chief Complaint  Patient presents with  . Hypertension  . B12 Injection  . Immunizations    HPI  She is here today for bp check and vit b12 injection.   Hypertension This is a chronic problem. The current episode started more than 1 year ago. The problem has been gradually improving since onset. The problem is controlled. Pertinent negatives include no blurred vision, chest pain, palpitations or shortness of breath. Risk factors for coronary artery disease include obesity and sedentary lifestyle. Past treatments include angiotensin blockers and diuretics. The current treatment provides moderate improvement.     Past Medical History:  Diagnosis Date  . Abnormal pap   . Allergy   . Anemia   . Bacterial vaginosis   . Cervicitis   . Colonic polyp   . Endometriosis   . Fibroids   . Glaucoma   . Hypertension   . Hypothyroidism   . Vulvitis      Family History  Problem Relation Age of Onset  . Hypertension Mother   . Cancer Mother        stomach  . Kidney disease Father   . Diabetes Maternal Aunt   . Diabetes Maternal Uncle   . Hypertension Sister   . Stroke Maternal Uncle      Current Outpatient Medications:  .  bimatoprost (LUMIGAN) 0.01 % SOLN, 1 drop at bedtime., Disp: , Rfl:  .  calcium carbonate (TUMS EX) 750 MG chewable tablet, Chew 1 tablet by mouth daily., Disp: , Rfl:  .  Cholecalciferol (VITAMIN D3) 125 MCG (5000 UT) TBDP, Take by mouth., Disp: , Rfl:  .  dorzolamide-timolol (COSOPT) 22.3-6.8 MG/ML ophthalmic solution, 1 drop 2 (two) times daily., Disp: , Rfl:  .  levothyroxine (SYNTHROID, LEVOTHROID) 88 MCG tablet, Take 88 mcg by mouth daily., Disp: , Rfl:  .  Netarsudil Dimesylate (RHOPRESSA) 0.02 % SOLN, Apply to eye., Disp: , Rfl:  .  olmesartan-hydrochlorothiazide (BENICAR HCT) 40-12.5 MG tablet, TAKE 1 TABLET BY MOUTH EVERY DAY, Disp: 90 tablet, Rfl:  1 .  pilocarpine (PILOCAR) 4 % ophthalmic solution, 1 drop 4 (four) times daily., Disp: , Rfl:    Allergies  Allergen Reactions  . Amoxicillin   . Codeine Itching  . Penicillins Hives and Itching  . Tetracyclines & Related      Review of Systems  Constitutional: Negative.   Eyes: Negative for blurred vision.  Respiratory: Negative.  Negative for shortness of breath.   Cardiovascular: Negative.  Negative for chest pain and palpitations.  Gastrointestinal: Negative.   Neurological: Negative.   Psychiatric/Behavioral: Negative.      Today's Vitals   01/25/19 1416  BP: 122/78  Pulse: (!) 105  Temp: 98.4 F (36.9 C)  TempSrc: Oral  Weight: 216 lb (98 kg)  Height: 5' 6.4" (1.687 m)   Body mass index is 34.44 kg/m.   Objective:  Physical Exam Vitals signs and nursing note reviewed.  Constitutional:      Appearance: Normal appearance.  HENT:     Head: Normocephalic and atraumatic.  Cardiovascular:     Rate and Rhythm: Normal rate and regular rhythm.     Heart sounds: Normal heart sounds.  Pulmonary:     Effort: Pulmonary effort is normal.     Breath sounds: Normal breath sounds.  Skin:    General: Skin is warm.  Neurological:  General: No focal deficit present.     Mental Status: She is alert.  Psychiatric:        Mood and Affect: Mood normal.        Behavior: Behavior normal.   Assessment/Plan:  1. Essential hypertension  Chronic, well controlled. She will continue with current meds. She is encouraged to avoid adding salt to her foods.   2. Acquired hypothyroidism  I will check thyroid panel and adjust meds as needed.  - TSH  3. Other abnormal glucose  HER A1C HAS BEEN ELEVATED IN THE PAST. I WILL CHECK AN A1C, BMET TODAY. SHE WAS ENCOURAGED TO AVOID SUGARY BEVERAGES AND PROCESSED FOODS INCLUDNG BREADS, RICE AND PASTA.  - BMP8+EGFR - Hemoglobin A1c  4. Vitamin B12 deficiency  I will check vitamin B12 level today. She was also given vit b12 IM x  1. She will rto in 3 months for her next injection.   - Vitamin B12 - cyanocobalamin ((VITAMIN B-12)) injection 1,000 mcg  5. Class 1 obesity due to excess calories with serious comorbidity and body mass index (BMI) of 34.0 to 34.9 in adult  Importance of achieving optimal weight to decrease risk of cardiovascular disease and cancers was discussed with the patient in full detail.  Importance of regular exercise was discussed with the patient.   She is encouraged to start slowly - start with 10 minutes twice daily at least three to four days per week and to gradually build to 30 minutes five days weekly. She was given tips to incorporate more activity into her daily routine - take stairs when possible, park farther away from her job, grocery stores, etc.  t  6. Encounter for immunization  - Flu Vaccine QUAD 6+ mos PF IM (Fluarix Quad PF)  Maximino Greenland, MD    THE PATIENT IS ENCOURAGED TO PRACTICE SOCIAL DISTANCING DUE TO THE COVID-19 PANDEMIC.

## 2019-06-21 ENCOUNTER — Other Ambulatory Visit: Payer: Self-pay | Admitting: Internal Medicine

## 2019-07-26 ENCOUNTER — Other Ambulatory Visit: Payer: Self-pay | Admitting: Internal Medicine

## 2019-07-27 ENCOUNTER — Other Ambulatory Visit: Payer: Self-pay

## 2019-07-27 ENCOUNTER — Telehealth: Payer: Self-pay

## 2019-07-27 NOTE — Telephone Encounter (Signed)
PT LVM ON 07/26/19 REQ REFILL OF THYROID MEDICATION, PT STATED THAT SHE HAD NOT SEEN DR Chalmers Cater IN OVER YEAR AND THIS IS WHY THE PHARMACY IS NOT REFILLING, PT WAS ADVISED TO CALL PHARMACY AND HAVE THEM SEND RX TO [facility] FOR REFILL

## 2019-08-09 ENCOUNTER — Encounter: Payer: 59 | Admitting: Internal Medicine

## 2019-08-18 ENCOUNTER — Other Ambulatory Visit: Payer: Self-pay

## 2019-08-18 ENCOUNTER — Ambulatory Visit: Payer: 59 | Admitting: Internal Medicine

## 2019-08-18 ENCOUNTER — Encounter: Payer: Self-pay | Admitting: Internal Medicine

## 2019-08-18 VITALS — BP 112/80 | HR 111 | Temp 98.3°F | Ht 67.0 in | Wt 225.0 lb

## 2019-08-18 DIAGNOSIS — I10 Essential (primary) hypertension: Secondary | ICD-10-CM

## 2019-08-18 DIAGNOSIS — R000 Tachycardia, unspecified: Secondary | ICD-10-CM | POA: Diagnosis not present

## 2019-08-18 DIAGNOSIS — E538 Deficiency of other specified B group vitamins: Secondary | ICD-10-CM

## 2019-08-18 DIAGNOSIS — Z0001 Encounter for general adult medical examination with abnormal findings: Secondary | ICD-10-CM

## 2019-08-18 DIAGNOSIS — Z0000 Encounter for general adult medical examination without abnormal findings: Secondary | ICD-10-CM

## 2019-08-18 LAB — POCT URINALYSIS DIPSTICK
Bilirubin, UA: NEGATIVE
Glucose, UA: NEGATIVE
Ketones, UA: NEGATIVE
Nitrite, UA: NEGATIVE
Protein, UA: NEGATIVE
Spec Grav, UA: 1.01 (ref 1.010–1.025)
Urobilinogen, UA: 0.2 E.U./dL
pH, UA: 7 (ref 5.0–8.0)

## 2019-08-18 LAB — POCT UA - MICROALBUMIN
Albumin/Creatinine Ratio, Urine, POC: <30
Creatinine, POC: 10 mg/dL
Microalbumin Ur, POC: 10 mg/L

## 2019-08-18 MED ORDER — CYANOCOBALAMIN 1000 MCG/ML IJ SOLN
1000.0000 ug | Freq: Once | INTRAMUSCULAR | Status: AC
  Administered 2019-08-18: 1000 ug via INTRAMUSCULAR

## 2019-08-18 NOTE — Patient Instructions (Signed)
Health Maintenance, Female Adopting a healthy lifestyle and getting preventive care are important in promoting health and wellness. Ask your health care provider about:  The right schedule for you to have regular tests and exams.  Things you can do on your own to prevent diseases and keep yourself healthy. What should I know about diet, weight, and exercise? Eat a healthy diet   Eat a diet that includes plenty of vegetables, fruits, low-fat dairy products, and lean protein.  Do not eat a lot of foods that are high in solid fats, added sugars, or sodium. Maintain a healthy weight Body mass index (BMI) is used to identify weight problems. It estimates body fat based on height and weight. Your health care provider can help determine your BMI and help you achieve or maintain a healthy weight. Get regular exercise Get regular exercise. This is one of the most important things you can do for your health. Most adults should:  Exercise for at least 150 minutes each week. The exercise should increase your heart rate and make you sweat (moderate-intensity exercise).  Do strengthening exercises at least twice a week. This is in addition to the moderate-intensity exercise.  Spend less time sitting. Even light physical activity can be beneficial. Watch cholesterol and blood lipids Have your blood tested for lipids and cholesterol at 62 years of age, then have this test every 5 years. Have your cholesterol levels checked more often if:  Your lipid or cholesterol levels are high.  You are older than 62 years of age.  You are at high risk for heart disease. What should I know about cancer screening? Depending on your health history and family history, you may need to have cancer screening at various ages. This may include screening for:  Breast cancer.  Cervical cancer.  Colorectal cancer.  Skin cancer.  Lung cancer. What should I know about heart disease, diabetes, and high blood  pressure? Blood pressure and heart disease  High blood pressure causes heart disease and increases the risk of stroke. This is more likely to develop in people who have high blood pressure readings, are of African descent, or are overweight.  Have your blood pressure checked: ? Every 3-5 years if you are 18-39 years of age. ? Every year if you are 40 years old or older. Diabetes Have regular diabetes screenings. This checks your fasting blood sugar level. Have the screening done:  Once every three years after age 40 if you are at a normal weight and have a low risk for diabetes.  More often and at a younger age if you are overweight or have a high risk for diabetes. What should I know about preventing infection? Hepatitis B If you have a higher risk for hepatitis B, you should be screened for this virus. Talk with your health care provider to find out if you are at risk for hepatitis B infection. Hepatitis C Testing is recommended for:  Everyone born from 1945 through 1965.  Anyone with known risk factors for hepatitis C. Sexually transmitted infections (STIs)  Get screened for STIs, including gonorrhea and chlamydia, if: ? You are sexually active and are younger than 62 years of age. ? You are older than 62 years of age and your health care provider tells you that you are at risk for this type of infection. ? Your sexual activity has changed since you were last screened, and you are at increased risk for chlamydia or gonorrhea. Ask your health care provider if   you are at risk.  Ask your health care provider about whether you are at high risk for HIV. Your health care provider may recommend a prescription medicine to help prevent HIV infection. If you choose to take medicine to prevent HIV, you should first get tested for HIV. You should then be tested every 3 months for as long as you are taking the medicine. Pregnancy  If you are about to stop having your period (premenopausal) and  you may become pregnant, seek counseling before you get pregnant.  Take 400 to 800 micrograms (mcg) of folic acid every day if you become pregnant.  Ask for birth control (contraception) if you want to prevent pregnancy. Osteoporosis and menopause Osteoporosis is a disease in which the bones lose minerals and strength with aging. This can result in bone fractures. If you are 65 years old or older, or if you are at risk for osteoporosis and fractures, ask your health care provider if you should:  Be screened for bone loss.  Take a calcium or vitamin D supplement to lower your risk of fractures.  Be given hormone replacement therapy (HRT) to treat symptoms of menopause. Follow these instructions at home: Lifestyle  Do not use any products that contain nicotine or tobacco, such as cigarettes, e-cigarettes, and chewing tobacco. If you need help quitting, ask your health care provider.  Do not use street drugs.  Do not share needles.  Ask your health care provider for help if you need support or information about quitting drugs. Alcohol use  Do not drink alcohol if: ? Your health care provider tells you not to drink. ? You are pregnant, may be pregnant, or are planning to become pregnant.  If you drink alcohol: ? Limit how much you use to 0-1 drink a day. ? Limit intake if you are breastfeeding.  Be aware of how much alcohol is in your drink. In the [place] one drink equals one 12 oz bottle of beer (355 mL), one 5 oz glass of wine (148 mL), or one 1 oz glass of hard liquor (44 mL). General instructions  Schedule regular health, dental, and eye exams.  Stay current with your vaccines.  Tell your health care provider if: ? You often feel depressed. ? You have ever been abused or do not feel safe at home. Summary  Adopting a healthy lifestyle and getting preventive care are important in promoting health and wellness.  Follow your health care provider's instructions about healthy  diet, exercising, and getting tested or screened for diseases.  Follow your health care provider's instructions on monitoring your cholesterol and blood pressure. This information is not intended to replace advice given to you by your health care provider. Make sure you discuss any questions you have with your health care provider. Document Revised: 05/05/2018 Document Reviewed: 05/05/2018 [employer] Patient Education  2020 [employer]  

## 2019-08-19 LAB — CBC
Hematocrit: 37.3 % (ref 34.0–46.6)
Hemoglobin: 12.9 g/dL (ref 11.1–15.9)
MCH: 30.6 pg (ref 26.6–33.0)
MCHC: 34.6 g/dL (ref 31.5–35.7)
MCV: 89 fL (ref 79–97)
Platelets: 204 10*3/uL (ref 150–450)
RBC: 4.21 x10E6/uL (ref 3.77–5.28)
RDW: 12.5 % (ref 11.7–15.4)
WBC: 5.4 10*3/uL (ref 3.4–10.8)

## 2019-08-19 LAB — CMP14+EGFR
ALT: 11 IU/L (ref 0–32)
AST: 20 IU/L (ref 0–40)
Albumin/Globulin Ratio: 0.9 — ABNORMAL LOW (ref 1.2–2.2)
Albumin: 3.9 g/dL (ref 3.8–4.8)
Alkaline Phosphatase: 72 IU/L (ref 39–117)
BUN/Creatinine Ratio: 10 — ABNORMAL LOW (ref 12–28)
BUN: 9 mg/dL (ref 8–27)
Bilirubin Total: 0.4 mg/dL (ref 0.0–1.2)
CO2: 27 mmol/L (ref 20–29)
Calcium: 9.1 mg/dL (ref 8.7–10.3)
Chloride: 102 mmol/L (ref 96–106)
Creatinine, Ser: 0.9 mg/dL (ref 0.57–1.00)
GFR calc Af Amer: 79 mL/min/{1.73_m2} (ref >59)
GFR calc non Af Amer: 69 mL/min/{1.73_m2} (ref >59)
Globulin, Total: 4.2 g/dL (ref 1.5–4.5)
Glucose: 80 mg/dL (ref 65–99)
Potassium: 3.5 mmol/L (ref 3.5–5.2)
Sodium: 140 mmol/L (ref 134–144)
Total Protein: 8.1 g/dL (ref 6.0–8.5)

## 2019-08-19 LAB — TSH: TSH: 1.15 u[IU]/mL (ref 0.450–4.500)

## 2019-08-19 LAB — HEMOGLOBIN A1C
Est. average glucose Bld gHb Est-mCnc: 131 mg/dL
Hgb A1c MFr Bld: 6.2 % — ABNORMAL HIGH (ref 4.8–5.6)

## 2019-08-19 LAB — LIPID PANEL
Chol/HDL Ratio: 3.1 ratio (ref 0.0–4.4)
Cholesterol, Total: 176 mg/dL (ref 100–199)
HDL: 57 mg/dL (ref >39)
LDL Chol Calc (NIH): 101 mg/dL — ABNORMAL HIGH (ref 0–99)
Triglycerides: 102 mg/dL (ref 0–149)
VLDL Cholesterol Cal: 18 mg/dL (ref 5–40)

## 2019-08-19 LAB — VITAMIN B12: Vitamin B-12: 321 pg/mL (ref 232–1245)

## 2019-08-19 LAB — T4, FREE: Free T4: 1.35 ng/dL (ref 0.82–1.77)

## 2019-08-20 ENCOUNTER — Other Ambulatory Visit: Payer: Self-pay | Admitting: Internal Medicine

## 2019-08-20 NOTE — Progress Notes (Signed)
This visit occurred during the SARS-CoV-2 public health emergency.  Safety protocols were in place, including screening questions prior to the visit, additional usage of staff PPE, and extensive cleaning of exam room while observing appropriate contact time as indicated for disinfecting solutions.  Subjective:     Patient ID: Julia Richards , female    DOB: [birth year] , 64 y.o.   MRN: [id number]   Chief Complaint  Patient presents with  . Annual Exam  . Hypertension    HPI  She is here today for a full physical examination. She is followed by GYN for her pelvic exams. She is followed at [facility], she was last seen by [facility], formerly followed by Dr. Raphael Gibney. She has no specific concerns or complaints at this time.   Hypertension This is a chronic problem. The current episode started more than 1 year ago. The problem has been gradually improving since onset. The problem is controlled. Pertinent negatives include no blurred vision, chest pain, palpitations or shortness of breath. Risk factors for coronary artery disease include obesity, sedentary lifestyle and post-menopausal state. Past treatments include angiotensin blockers and diuretics.     Past Medical History:  Diagnosis Date  . Abnormal pap   . Allergy   . Anemia   . Bacterial vaginosis   . Cervicitis   . Colonic polyp   . Endometriosis   . Fibroids   . Glaucoma   . Hypertension   . Hypothyroidism   . Vulvitis      Family History  Problem Relation Age of Onset  . Hypertension Mother   . Cancer Mother        stomach  . Kidney disease Father   . Diabetes Maternal Aunt   . Diabetes Maternal Uncle   . Hypertension Sister   . Stroke Maternal Uncle      Current Outpatient Medications:  .  bimatoprost (LUMIGAN) 0.01 % SOLN, 1 drop at bedtime., Disp: , Rfl:  .  brimonidine (ALPHAGAN P) 0.1 % SOLN, INSTILL 1 DROP IN BOTH EYES THREE TIMES DAILY, Disp: , Rfl:  .  calcium carbonate (TUMS EX) 750 MG chewable tablet, Chew  1 tablet by mouth daily., Disp: , Rfl:  .  Cholecalciferol (VITAMIN D3) 125 MCG (5000 UT) TBDP, Take by mouth., Disp: , Rfl:  .  dorzolamide-timolol (COSOPT) 22.3-6.8 MG/ML ophthalmic solution, 1 drop 2 (two) times daily., Disp: , Rfl:  .  levothyroxine (SYNTHROID) 88 MCG tablet, Take 1 tablet (88 mcg total) by mouth daily., Disp: 30 tablet, Rfl: 11 .  Netarsudil Dimesylate (RHOPRESSA) 0.02 % SOLN, Apply to eye., Disp: , Rfl:  .  olmesartan-hydrochlorothiazide (BENICAR HCT) 40-12.5 MG tablet, TAKE 1 TABLET BY MOUTH EVERY DAY, Disp: 90 tablet, Rfl: 1 .  pilocarpine (PILOCAR) 4 % ophthalmic solution, 1 drop 4 (four) times daily., Disp: , Rfl:    Allergies  Allergen Reactions  . Amoxicillin   . Codeine Itching  . Penicillins Hives and Itching  . Tetracyclines & Related      The patient states she uses post menopausal status for birth control. Last LMP was No LMP recorded. Patient is postmenopausal.. Negative for Dysmenorrhea Negative for: breast discharge, breast lump(s), breast pain and breast self exam. Associated symptoms include abnormal vaginal bleeding. Pertinent negatives include abnormal bleeding (hematology), anxiety, decreased libido, depression, difficulty falling sleep, dyspareunia, history of infertility, nocturia, sexual dysfunction, sleep disturbances, urinary incontinence, urinary urgency, vaginal discharge and vaginal itching. Diet regular.The patient states her exercise level is  minimal.   .  The patient's tobacco use is:  Social History   Tobacco Use  Smoking Status Never Smoker  Smokeless Tobacco Never Used  . She has been exposed to passive smoke. The patient's alcohol use is:  Social History   Substance and Sexual Activity  Alcohol Use Yes    Review of Systems  Constitutional: Negative.   HENT: Negative.   Eyes: Negative.  Negative for blurred vision.  Respiratory: Negative.  Negative for shortness of breath.   Cardiovascular: Negative.  Negative for chest pain  and palpitations.  Endocrine: Negative.   Genitourinary: Negative.   Musculoskeletal: Negative.   Skin: Negative.   Allergic/Immunologic: Negative.   Neurological: Negative.   Hematological: Negative.   Psychiatric/Behavioral: Negative.      Today's Vitals   08/18/19 1535  BP: 112/80  Pulse: (!) 111  Temp: 98.3 F (36.8 C)  TempSrc: Oral  Weight: 225 lb (102.1 kg)  Height: _0  (1.702 m)   Body mass index is 35.24 kg/m.   Objective:  Physical Exam Vitals and nursing note reviewed.  Constitutional:      Appearance: Normal appearance.  HENT:     Head: Normocephalic and atraumatic.     Right Ear: Tympanic membrane, ear canal and external ear normal.     Left Ear: Tympanic membrane, ear canal and external ear normal.     Nose:     Comments: Deferred, masked    Mouth/Throat:     Comments: Deferred, masked Eyes:     Extraocular Movements: Extraocular movements intact.     Conjunctiva/sclera: Conjunctivae normal.     Pupils: Pupils are equal, round, and reactive to light.  Cardiovascular:     Rate and Rhythm: Normal rate and regular rhythm.     Pulses: Normal pulses.     Heart sounds: Normal heart sounds.  Pulmonary:     Effort: Pulmonary effort is normal.     Breath sounds: Normal breath sounds.  Abdominal:     General: Bowel sounds are normal.     Palpations: Abdomen is soft.     Comments: Rounded, soft  Genitourinary:    Comments: deferred Musculoskeletal:        General: Normal range of motion.     Cervical back: Normal range of motion and neck supple.  Skin:    General: Skin is warm and dry.  Neurological:     General: No focal deficit present.     Mental Status: She is alert and oriented to person, place, and time.  Psychiatric:        Mood and Affect: Mood normal.        Behavior: Behavior normal.         Assessment And Plan:     1. Routine general medical examination at health care facility  A full exam was performed.  Importance of monthly  self breast exams was discussed with the patient. PATIENT IS ADVISED TO GET 30-45 MINUTES REGULAR EXERCISE NO LESS THAN FOUR TO FIVE DAYS PER WEEK - BOTH WEIGHTBEARING EXERCISES AND AEROBIC ARE RECOMMENDED.  SHE IS ADVISED TO FOLLOW A HEALTHY DIET WITH AT LEAST SIX FRUITS/VEGGIES PER DAY, DECREASE INTAKE OF RED MEAT, AND TO INCREASE FISH INTAKE TO TWO DAYS PER WEEK.  MEATS/FISH SHOULD NOT BE FRIED, BAKED OR BROILED IS PREFERABLE.  I SUGGEST WEARING SPF 50 SUNSCREEN ON EXPOSED PARTS AND ESPECIALLY WHEN IN THE DIRECT SUNLIGHT FOR AN EXTENDED PERIOD OF TIME.  PLEASE AVOID FAST FOOD RESTAURANTS AND INCREASE YOUR WATER INTAKE.  -  CMP14+EGFR - CBC - Lipid panel - Hemoglobin A1c - TSH - T4, Free  2. Essential hypertension  Chronic, well controlled. She will continue with current meds. She is encouraged to avoid adding salt to her foods. EKG performed, NSR w/o acute changes. She will rto in six months for re-evaluation.   - POCT Urinalysis Dipstick (81002) - POCT UA - Microalbumin - EKG 12-Lead  3. Vitamin B12 deficiency  She was given vitamin B12 IM x1, after serum vitamin B12 level was drawn. She will rto in 6-8 weeks for her next injection.   - Vitamin B12 - cyanocobalamin ((VITAMIN B-12)) injection 1,000 mcg  4. Tachycardia  HR elevated, she denies palpitations. She is encouraged to increase her water intake and to limit her caffeine intake. I will reassess at her next visit.       Maximino Greenland, MD    THE PATIENT IS ENCOURAGED TO PRACTICE SOCIAL DISTANCING DUE TO THE COVID-19 PANDEMIC.

## 2019-10-03 ENCOUNTER — Ambulatory Visit: Payer: 59

## 2019-11-11 ENCOUNTER — Encounter: Payer: Self-pay | Admitting: Internal Medicine

## 2020-01-13 ENCOUNTER — Other Ambulatory Visit: Payer: Self-pay | Admitting: Internal Medicine

## 2020-01-16 ENCOUNTER — Encounter: Payer: Self-pay | Admitting: Internal Medicine

## 2020-01-16 LAB — HM MAMMOGRAPHY

## 2020-01-18 ENCOUNTER — Encounter: Payer: Self-pay | Admitting: Internal Medicine

## 2020-01-19 ENCOUNTER — Other Ambulatory Visit: Payer: Self-pay

## 2020-01-19 MED ORDER — VALSARTAN-HYDROCHLOROTHIAZIDE 160-12.5 MG PO TABS: 1.0000 | ORAL_TABLET | Freq: Every day | ORAL | 1 refills | Status: DC

## 2020-01-19 NOTE — Telephone Encounter (Signed)
lvm for pt to return call °

## 2020-01-31 ENCOUNTER — Other Ambulatory Visit: Payer: 59

## 2020-01-31 ENCOUNTER — Ambulatory Visit: Payer: 59

## 2020-01-31 ENCOUNTER — Other Ambulatory Visit: Payer: Self-pay

## 2020-01-31 VITALS — BP 130/76 | HR 101 | Temp 98.0°F | Ht 67.0 in | Wt 224.0 lb

## 2020-01-31 DIAGNOSIS — I10 Essential (primary) hypertension: Secondary | ICD-10-CM

## 2020-01-31 DIAGNOSIS — Z79899 Other long term (current) drug therapy: Secondary | ICD-10-CM

## 2020-01-31 NOTE — Progress Notes (Signed)
Pt presents for b/p check she is currently taking valsartan hydrochlorothiazide 160-12.5 her b/p today 130/76   Per RS: perfeCt! I thimk her mds changed. needs bmp today too

## 2020-02-01 LAB — BMP8+EGFR
BUN/Creatinine Ratio: 14 (ref 12–28)
BUN: 12 mg/dL (ref 8–27)
CO2: 30 mmol/L — ABNORMAL HIGH (ref 20–29)
Calcium: 9.2 mg/dL (ref 8.7–10.3)
Chloride: 99 mmol/L (ref 96–106)
Creatinine, Ser: 0.88 mg/dL (ref 0.57–1.00)
GFR calc Af Amer: 81 mL/min/{1.73_m2} (ref >59)
GFR calc non Af Amer: 71 mL/min/{1.73_m2} (ref >59)
Glucose: 95 mg/dL (ref 65–99)
Potassium: 3.8 mmol/L (ref 3.5–5.2)
Sodium: 138 mmol/L (ref 134–144)

## 2020-02-07 ENCOUNTER — Encounter: Payer: Self-pay | Admitting: Internal Medicine

## 2020-02-20 ENCOUNTER — Ambulatory Visit: Payer: 59 | Admitting: Internal Medicine

## 2020-02-29 ENCOUNTER — Ambulatory Visit: Payer: 59 | Admitting: Internal Medicine

## 2020-02-29 ENCOUNTER — Encounter: Payer: Self-pay | Admitting: Internal Medicine

## 2020-03-05 ENCOUNTER — Ambulatory Visit: Payer: 59 | Admitting: Internal Medicine

## 2020-03-06 ENCOUNTER — Ambulatory Visit: Payer: 59

## 2020-03-16 ENCOUNTER — Other Ambulatory Visit: Payer: Self-pay | Admitting: Internal Medicine

## 2020-03-19 ENCOUNTER — Other Ambulatory Visit: Payer: Self-pay

## 2020-03-19 ENCOUNTER — Ambulatory Visit: Payer: 59 | Admitting: Internal Medicine

## 2020-03-19 ENCOUNTER — Encounter: Payer: Self-pay | Admitting: Internal Medicine

## 2020-03-19 VITALS — BP 114/76 | HR 97 | Temp 98.8°F | Ht 67.0 in | Wt 220.6 lb

## 2020-03-19 DIAGNOSIS — R55 Syncope and collapse: Secondary | ICD-10-CM | POA: Diagnosis not present

## 2020-03-19 DIAGNOSIS — Z23 Encounter for immunization: Secondary | ICD-10-CM

## 2020-03-19 DIAGNOSIS — I10 Essential (primary) hypertension: Secondary | ICD-10-CM | POA: Diagnosis not present

## 2020-03-19 DIAGNOSIS — E6609 Other obesity due to excess calories: Secondary | ICD-10-CM

## 2020-03-19 DIAGNOSIS — Z6834 Body mass index (BMI) 34.0-34.9, adult: Secondary | ICD-10-CM

## 2020-03-19 NOTE — Progress Notes (Signed)
I,Katawbba Wiggins,acting as a Education administrator for Maximino Greenland, MD.,have documented all relevant documentation on the behalf of Maximino Greenland, MD,as directed by  Maximino Greenland, MD while in the presence of Maximino Greenland, MD.  This visit occurred during the SARS-CoV-2 public health emergency.  Safety protocols were in place, including screening questions prior to the visit, additional usage of staff PPE, and extensive cleaning of exam room while observing appropriate contact time as indicated for disinfecting solutions.  Subjective:     Patient ID: Julia Richards , female    DOB: 1955-12-14 , 64 y.o.   MRN: [id number]   Chief Complaint  Patient presents with  . Hypertension    HPI  The patient is here today for a blood pressure follow-up. She was switched to valsartan/hctz 160/12.84m once daily because of cost of her previous medication. She has not had any issues with the new medication. Denies headaches, lightheadedness and dizziness. She reports that she has incorporated more exercise into her daily routine.   Hypertension This is a chronic problem. The current episode started more than 1 year ago. The problem has been gradually improving since onset. The problem is controlled. Pertinent negatives include no blurred vision, chest pain, palpitations or shortness of breath. Risk factors for coronary artery disease include obesity and sedentary lifestyle. Past treatments include angiotensin blockers and diuretics. The current treatment provides moderate improvement.     Past Medical History:  Diagnosis Date  . Abnormal pap   . Allergy   . Anemia   . Bacterial vaginosis   . Cervicitis   . Colonic polyp   . Endometriosis   . Fibroids   . Glaucoma   . Hypertension   . Hypothyroidism   . Vulvitis      Family History  Problem Relation Age of Onset  . Hypertension Mother   . Cancer Mother        stomach  . Kidney disease Father   . Diabetes Maternal Aunt   . Diabetes Maternal  Uncle   . Hypertension Sister   . Stroke Maternal Uncle      Current Outpatient Medications:  .  bimatoprost (LUMIGAN) 0.01 % SOLN, 1 drop at bedtime., Disp: , Rfl:  .  brimonidine (ALPHAGAN P) 0.1 % SOLN, INSTILL 1 DROP IN BOTH EYES THREE TIMES DAILY, Disp: , Rfl:  .  calcium carbonate (TUMS EX) 750 MG chewable tablet, Chew 1 tablet by mouth daily., Disp: , Rfl:  .  Cholecalciferol (VITAMIN D3) 125 MCG (5000 UT) TBDP, Take by mouth., Disp: , Rfl:  .  dorzolamide-timolol (COSOPT) 22.3-6.8 MG/ML ophthalmic solution, 1 drop 2 (two) times daily., Disp: , Rfl:  .  levothyroxine (SYNTHROID) 88 MCG tablet, TAKE 1 TABLET BY MOUTH EVERY DAY, Disp: 30 tablet, Rfl: 11 .  Netarsudil Dimesylate (RHOPRESSA) 0.02 % SOLN, Apply to eye., Disp: , Rfl:  .  pilocarpine (PILOCAR) 4 % ophthalmic solution, 1 drop 4 (four) times daily., Disp: , Rfl:  .  valsartan-hydrochlorothiazide (DIOVAN-HCT) 160-12.5 MG tablet, TAKE 1 TABLET BY MOUTH DAILY, Disp: 90 tablet, Rfl: 1   Allergies  Allergen Reactions  . Amoxicillin   . Codeine Itching  . Penicillins Hives and Itching  . Tetracyclines & Related      Review of Systems  Constitutional: Negative.   Eyes: Negative for blurred vision.  Respiratory: Negative.  Negative for shortness of breath.   Cardiovascular: Negative.  Negative for chest pain and palpitations.  Gastrointestinal: Negative.   Neurological:  She states a few weeks ago (on Oct 4th)she "fainted" while at work. She reports she had on raincoat and sweater and was talking with a coworker, felt hot and passed out. She thinks she lost consciousness for a few seconds. She hit her head on the banister as she fell down. EMS was called, she was evaluated - advised vitals were okay. She reportedly had neg orthostatics. She states EKG was done as well as her BP. She declined going to the hospital for further evaluation.   Psychiatric/Behavioral: Negative.   All other systems reviewed and are negative.     Today's Vitals   03/19/20 1448  BP: 114/76  Pulse: 97  Temp: 98.8 F (37.1 C)  TempSrc: Oral  Weight: 220 lb 9.6 oz (100.1 kg)  Height: '5\' 7"'  (1.702 m)  PainSc: 2   PainLoc: Head   Body mass index is 34.55 kg/m.  Wt Readings from Last 3 Encounters:  03/19/20 220 lb 9.6 oz (100.1 kg)  01/31/20 224 lb (101.6 kg)  08/18/19 225 lb (102.1 kg)    Objective:  Physical Exam Vitals and nursing note reviewed.  Constitutional:      Appearance: Normal appearance. She is obese.  HENT:     Head: Normocephalic and atraumatic.  Cardiovascular:     Rate and Rhythm: Normal rate and regular rhythm.     Heart sounds: Normal heart sounds.  Pulmonary:     Breath sounds: Normal breath sounds.  Skin:    General: Skin is warm.  Neurological:     General: No focal deficit present.     Mental Status: She is alert and oriented to person, place, and time.         Assessment And Plan:     1. Essential hypertension Comments: Chronic, well controlled. She will continue with current meds. I will check renal function today.  - BMP8+EGFR  2. Syncope and collapse Comments: Occurred on 10/4. I will perform orthostatics.Unfortunately, pt had to leave prior to this being performed.   3. Need for vaccination Comments: She was given flu vaccine to update her immunizations.  - Flu Vaccine QUAD 6+ mos PF IM (Fluarix Quad PF)  4. Class 1 obesity due to excess calories with serious comorbidity and body mass index (BMI) of 34.0 to 34.9 in adult Comments: She was congratulated on her 4 pound weight loss. Encouraged to keep up the great work.    Patient was given opportunity to ask questions. Patient verbalized understanding of the plan and was able to repeat key elements of the plan. All questions were answered to their satisfaction.  Maximino Greenland, MD   I, Maximino Greenland, MD, have reviewed all documentation for this visit. The documentation on 03/20/20 for the exam, diagnosis, procedures, and  orders are all accurate and complete.  THE PATIENT IS ENCOURAGED TO PRACTICE SOCIAL DISTANCING DUE TO THE COVID-19 PANDEMIC.

## 2020-03-19 NOTE — Patient Instructions (Signed)

## 2020-03-20 ENCOUNTER — Encounter: Payer: Self-pay | Admitting: Internal Medicine

## 2020-03-20 LAB — BMP8+EGFR
BUN/Creatinine Ratio: 10 — ABNORMAL LOW (ref 12–28)
BUN: 9 mg/dL (ref 8–27)
CO2: 28 mmol/L (ref 20–29)
Calcium: 9 mg/dL (ref 8.7–10.3)
Chloride: 100 mmol/L (ref 96–106)
Creatinine, Ser: 0.89 mg/dL (ref 0.57–1.00)
GFR calc Af Amer: 80 mL/min/{1.73_m2} (ref >59)
GFR calc non Af Amer: 70 mL/min/{1.73_m2} (ref >59)
Glucose: 119 mg/dL — ABNORMAL HIGH (ref 65–99)
Potassium: 3.9 mmol/L (ref 3.5–5.2)
Sodium: 138 mmol/L (ref 134–144)

## 2020-03-22 ENCOUNTER — Encounter: Payer: Self-pay | Admitting: Internal Medicine

## 2020-04-09 ENCOUNTER — Encounter: Payer: Self-pay | Admitting: Internal Medicine

## 2020-04-24 ENCOUNTER — Ambulatory Visit: Payer: 59

## 2020-05-26 ENCOUNTER — Encounter: Payer: Self-pay | Admitting: Internal Medicine

## 2020-06-05 ENCOUNTER — Ambulatory Visit: Payer: Self-pay

## 2020-08-14 ENCOUNTER — Other Ambulatory Visit: Payer: Self-pay | Admitting: Internal Medicine

## 2020-08-27 ENCOUNTER — Encounter: Payer: Self-pay | Admitting: Internal Medicine

## 2020-08-27 ENCOUNTER — Ambulatory Visit: Payer: 59 | Admitting: Internal Medicine

## 2020-08-27 ENCOUNTER — Other Ambulatory Visit: Payer: Self-pay

## 2020-08-27 VITALS — BP 126/84 | HR 115 | Temp 98.1°F | Ht 67.0 in | Wt 221.6 lb

## 2020-08-27 DIAGNOSIS — R000 Tachycardia, unspecified: Secondary | ICD-10-CM

## 2020-08-27 DIAGNOSIS — Z0000 Encounter for general adult medical examination without abnormal findings: Secondary | ICD-10-CM | POA: Diagnosis not present

## 2020-08-27 DIAGNOSIS — E6609 Other obesity due to excess calories: Secondary | ICD-10-CM

## 2020-08-27 DIAGNOSIS — I10 Essential (primary) hypertension: Secondary | ICD-10-CM | POA: Diagnosis not present

## 2020-08-27 DIAGNOSIS — R7309 Other abnormal glucose: Secondary | ICD-10-CM

## 2020-08-27 DIAGNOSIS — E538 Deficiency of other specified B group vitamins: Secondary | ICD-10-CM

## 2020-08-27 DIAGNOSIS — Z6834 Body mass index (BMI) 34.0-34.9, adult: Secondary | ICD-10-CM

## 2020-08-27 DIAGNOSIS — E039 Hypothyroidism, unspecified: Secondary | ICD-10-CM | POA: Diagnosis not present

## 2020-08-27 LAB — POCT URINALYSIS DIPSTICK
Bilirubin, UA: NEGATIVE
Blood, UA: NEGATIVE
Glucose, UA: NEGATIVE
Ketones, UA: NEGATIVE
Nitrite, UA: NEGATIVE
Protein, UA: NEGATIVE
Spec Grav, UA: 1.015 (ref 1.010–1.025)
Urobilinogen, UA: 0.2 E.U./dL
pH, UA: 5.5 (ref 5.0–8.0)

## 2020-08-27 LAB — POCT UA - MICROALBUMIN
Albumin/Creatinine Ratio, Urine, POC: 30
Creatinine, POC: 100 mg/dL
Microalbumin Ur, POC: 10 mg/L

## 2020-08-27 MED ORDER — CYANOCOBALAMIN 1000 MCG/ML IJ SOLN
1000.0000 ug | Freq: Once | INTRAMUSCULAR | Status: AC
  Administered 2020-08-27: 1000 ug via INTRAMUSCULAR

## 2020-08-27 NOTE — Progress Notes (Signed)
I,Katawbba Wiggins,acting as a Education administrator for Maximino Greenland, MD.,have documented all relevant documentation on the behalf of Maximino Greenland, MD,as directed by  Maximino Greenland, MD while in the presence of Maximino Greenland, MD.  This visit occurred during the SARS-CoV-2 public health emergency.  Safety protocols were in place, including screening questions prior to the visit, additional usage of staff PPE, and extensive cleaning of exam room while observing appropriate contact time as indicated for disinfecting solutions.  Subjective:     Patient ID: Julia Richards , female    DOB: 07/12/1955 , 65 y.o.   MRN: [id number]   Chief Complaint  Patient presents with  . Annual Exam  . Hypertension    HPI  She is here today for a full physical examination. She is followed by GYN for her pelvic exams. She is followed at Dr. Alwyn Pea at [facility]. She has no specific concerns or complaints at this time.   Hypertension This is a chronic problem. The current episode started more than 1 year ago. The problem has been gradually improving since onset. The problem is controlled. Pertinent negatives include no blurred vision, chest pain, palpitations or shortness of breath. Risk factors for coronary artery disease include obesity, sedentary lifestyle and post-menopausal state. Past treatments include angiotensin blockers and diuretics.     Past Medical History:  Diagnosis Date  . Abnormal pap   . Allergy   . Anemia   . Bacterial vaginosis   . Cervicitis   . Colonic polyp   . Endometriosis   . Fibroids   . Glaucoma   . Hypertension   . Hypothyroidism   . Vulvitis      Family History  Problem Relation Age of Onset  . Hypertension Mother   . Cancer Mother        stomach  . Kidney disease Father   . Diabetes Maternal Aunt   . Diabetes Maternal Uncle   . Hypertension Sister   . Stroke Maternal Uncle      Current Outpatient Medications:  .  bimatoprost (LUMIGAN) 0.01 % SOLN, 1  drop at bedtime., Disp: , Rfl:  .  brimonidine (ALPHAGAN P) 0.1 % SOLN, INSTILL 1 DROP IN BOTH EYES THREE TIMES DAILY, Disp: , Rfl:  .  calcium carbonate (TUMS EX) 750 MG chewable tablet, Chew 1 tablet by mouth daily., Disp: , Rfl:  .  Cholecalciferol (VITAMIN D3) 125 MCG (5000 UT) TBDP, Take by mouth., Disp: , Rfl:  .  dorzolamide-timolol (COSOPT) 22.3-6.8 MG/ML ophthalmic solution, 1 drop 2 (two) times daily., Disp: , Rfl:  .  levothyroxine (SYNTHROID) 88 MCG tablet, TAKE 1 TABLET BY MOUTH EVERY DAY, Disp: 30 tablet, Rfl: 2 .  Netarsudil Dimesylate 0.02 % SOLN, Apply to eye., Disp: , Rfl:  .  pilocarpine (PILOCAR) 4 % ophthalmic solution, 1 drop 4 (four) times daily., Disp: , Rfl:  .  valsartan-hydrochlorothiazide (DIOVAN-HCT) 160-12.5 MG tablet, TAKE 1 TABLET BY MOUTH DAILY, Disp: 90 tablet, Rfl: 1   Allergies  Allergen Reactions  . Amoxicillin   . Codeine Itching  . Penicillins Hives and Itching  . Tetracyclines & Related       The patient states she uses post menopausal status for birth control. Last LMP was No LMP recorded. Patient is postmenopausal.. Negative for Dysmenorrhea. Negative for: breast discharge, breast lump(s), breast pain and breast self exam. Associated symptoms include abnormal vaginal bleeding. Pertinent negatives include abnormal bleeding (hematology), anxiety, decreased libido, depression, difficulty falling sleep,  dyspareunia, history of infertility, nocturia, sexual dysfunction, sleep disturbances, urinary incontinence, urinary urgency, vaginal discharge and vaginal itching. Diet regular.The patient states her exercise level is  intermittent.  . The patient's tobacco use is:  Social History   Tobacco Use  Smoking Status Never Smoker  Smokeless Tobacco Never Used  . She has been exposed to passive smoke. The patient's alcohol use is:  Social History   Substance and Sexual Activity  Alcohol Use Yes   Review of Systems  Constitutional: Negative.   HENT:  Negative.   Eyes: Negative.  Negative for blurred vision.  Respiratory: Negative.  Negative for shortness of breath.   Cardiovascular: Negative.  Negative for chest pain and palpitations.  Gastrointestinal: Negative.   Endocrine: Negative.   Genitourinary: Negative.   Musculoskeletal: Negative.   Skin: Negative.   Allergic/Immunologic: Negative.   Neurological: Negative.   Hematological: Negative.   Psychiatric/Behavioral: Negative.      Today's Vitals   08/27/20 1436  BP: 126/84  Pulse: (!) 115  Temp: 98.1 F (36.7 C)  TempSrc: Oral  Weight: 221 lb 9.6 oz (100.5 kg)  Height: '5\' 7"'  (1.702 m)   Body mass index is 34.71 kg/m.  Wt Readings from Last 3 Encounters:  08/27/20 221 lb 9.6 oz (100.5 kg)  03/19/20 220 lb 9.6 oz (100.1 kg)  01/31/20 224 lb (101.6 kg)   Objective:  Physical Exam Vitals and nursing note reviewed.  Constitutional:      Appearance: Normal appearance.  HENT:     Head: Normocephalic and atraumatic.     Right Ear: Tympanic membrane, ear canal and external ear normal.     Left Ear: Tympanic membrane, ear canal and external ear normal.     Nose:     Comments: Masked     Mouth/Throat:     Comments: Masked  Eyes:     Extraocular Movements: Extraocular movements intact.     Conjunctiva/sclera: Conjunctivae normal.     Pupils: Pupils are equal, round, and reactive to light.  Cardiovascular:     Rate and Rhythm: Regular rhythm. Tachycardia present.     Pulses: Normal pulses.     Heart sounds: Normal heart sounds.  Pulmonary:     Effort: Pulmonary effort is normal.     Breath sounds: Normal breath sounds.  Chest:  Breasts:     Tanner Score is 5.     Right: Normal.     Left: Normal.    Abdominal:     General: Abdomen is flat. Bowel sounds are normal.     Palpations: Abdomen is soft.  Genitourinary:    Comments: deferred Musculoskeletal:        General: Normal range of motion.     Cervical back: Normal range of motion and neck supple.   Skin:    General: Skin is warm and dry.  Neurological:     General: No focal deficit present.     Mental Status: She is alert and oriented to person, place, and time.  Psychiatric:        Mood and Affect: Mood normal.        Behavior: Behavior normal.         Assessment And Plan:     1. Routine general medical examination at health care facility Comments: A full exam was performed. Importance of monthly self breast exams was discussed with the patient. PATIENT IS ADVISED TO GET 30-45 MINUTES REGULAR EXERCISE NO LESS THAN FOUR TO FIVE DAYS PER WEEK - BOTH  WEIGHTBEARING EXERCISES AND AEROBIC ARE RECOMMENDED.  PATIENT IS ADVISED TO FOLLOW A HEALTHY DIET WITH AT LEAST SIX FRUITS/VEGGIES PER DAY, DECREASE INTAKE OF RED MEAT, AND TO INCREASE FISH INTAKE TO TWO DAYS PER WEEK.  MEATS/FISH SHOULD NOT BE FRIED, BAKED OR BROILED IS PREFERABLE.  IT IS ALSO IMPORTANT TO CUT BACK ON YOUR SUGAR INTAKE. PLEASE AVOID ANYTHING WITH ADDED SUGAR, CORN SYRUP OR OTHER SWEETENERS. IF YOU MUST USE A SWEETENER, YOU CAN TRY STEVIA. IT IS ALSO IMPORTANT TO AVOID ARTIFICIALLY SWEETENERS AND DIET BEVERAGES. LASTLY, I SUGGEST WEARING SPF 50 SUNSCREEN ON EXPOSED PARTS AND ESPECIALLY WHEN IN THE DIRECT SUNLIGHT FOR AN EXTENDED PERIOD OF TIME.  PLEASE AVOID FAST FOOD RESTAURANTS AND INCREASE YOUR WATER INTAKE.  - CBC - CMP14+EGFR - Lipid panel  2. Essential hypertension Comments: Well controlled. EKG performed, Sinus tachy w/ HR 105. She is encouraged to follow low sodium diet. She will f/u in six months for re-evaluation.  - POCT Urinalysis Dipstick (81002) - POCT UA - Microalbumin - EKG 12-Lead  3. Tachycardia Comments: She declines having palpitations. Encouraged to cut back on caffeine intake. increase water intake and start Mg supplementation. I will also check TSH. She will f/u in 3 weeks for a nurse visit.  - CBC - CMP14+EGFR - Magnesium - TSH  4. Acquired hypothyroidism Comments: I will check thyroid  panel and adjust meds as needed.  She will f/u in six months for re-evaluation.  - TSH - T4, free  5. Vitamin B12 deficiency Comments: I will check vitamin B12 level today, prior to administering vitamin B12 injection.  - Vitamin B12 - cyanocobalamin ((VITAMIN B-12)) injection 1,000 mcg  6. Other abnormal glucose Comments: Her a1c has been elevated in the past. I will recheck this today, encouraged to avoid sweetened beverages, including diet drinks.  - Hemoglobin A1c  7. Class 1 obesity due to excess calories with serious comorbidity and body mass index (BMI) of 34.0 to 34.9 in adult She is encouraged to strive for BMI less than 30 to decrease cardiac risk. Advised to aim for at least 150 minutes of exercise per week.   Patient was given opportunity to ask questions. Patient verbalized understanding of the plan and was able to repeat key elements of the plan. All questions were answered to their satisfaction.   I, Maximino Greenland, MD, have reviewed all documentation for this visit. The documentation on 09/09/20 for the exam, diagnosis, procedures, and orders are all accurate and complete.  THE PATIENT IS ENCOURAGED TO PRACTICE SOCIAL DISTANCING DUE TO THE COVID-19 PANDEMIC.

## 2020-08-27 NOTE — Patient Instructions (Addendum)
Magnesium glycinate - 125mg  daily, work up to 250mg  nightly   Health Maintenance, Female Adopting a healthy lifestyle and getting preventive care are important in promoting health and wellness. Ask your health care provider about:  The right schedule for you to have regular tests and exams.  Things you can do on your own to prevent diseases and keep yourself healthy. What should I know about diet, weight, and exercise? Eat a healthy diet  Eat a diet that includes plenty of vegetables, fruits, low-fat dairy products, and lean protein.  Do not eat a lot of foods that are high in solid fats, added sugars, or sodium.   Maintain a healthy weight Body mass index (BMI) is used to identify weight problems. It estimates body fat based on height and weight. Your health care provider can help determine your BMI and help you achieve or maintain a healthy weight. Get regular exercise Get regular exercise. This is one of the most important things you can do for your health. Most adults should:  Exercise for at least 150 minutes each week. The exercise should increase your heart rate and make you sweat (moderate-intensity exercise).  Do strengthening exercises at least twice a week. This is in addition to the moderate-intensity exercise.  Spend less time sitting. Even light physical activity can be beneficial. Watch cholesterol and blood lipids Have your blood tested for lipids and cholesterol at 63 years of age, then have this test every 5 years. Have your cholesterol levels checked more often if:  Your lipid or cholesterol levels are high.  You are older than 63 years of age.  You are at high risk for heart disease. What should I know about cancer screening? Depending on your health history and family history, you may need to have cancer screening at various ages. This may include screening for:  Breast cancer.  Cervical cancer.  Colorectal cancer.  Skin cancer.  Lung cancer. What  should I know about heart disease, diabetes, and high blood pressure? Blood pressure and heart disease  High blood pressure causes heart disease and increases the risk of stroke. This is more likely to develop in people who have high blood pressure readings, are of African descent, or are overweight.  Have your blood pressure checked: ? Every 3-5 years if you are 39-32 years of age. ? Every year if you are 75 years old or older. Diabetes Have regular diabetes screenings. This checks your fasting blood sugar level. Have the screening done:  Once every three years after age 8 if you are at a normal weight and have a low risk for diabetes.  More often and at a younger age if you are overweight or have a high risk for diabetes. What should I know about preventing infection? Hepatitis B If you have a higher risk for hepatitis B, you should be screened for this virus. Talk with your health care provider to find out if you are at risk for hepatitis B infection. Hepatitis C Testing is recommended for:  Everyone born from [birth year] through 1965.  Anyone with known risk factors for hepatitis C. Sexually transmitted infections (STIs)  Get screened for STIs, including gonorrhea and chlamydia, if: ? You are sexually active and are younger than 63 years of age. ? You are older than 63 years of age and your health care provider tells you that you are at risk for this type of infection. ? Your sexual activity has changed since you were last screened, and you are  at increased risk for chlamydia or gonorrhea. Ask your health care provider if you are at risk.  Ask your health care provider about whether you are at high risk for HIV. Your health care provider may recommend a prescription medicine to help prevent HIV infection. If you choose to take medicine to prevent HIV, you should first get tested for HIV. You should then be tested every 3 months for as long as you are taking the medicine. Pregnancy  If  you are about to stop having your period (premenopausal) and you may become pregnant, seek counseling before you get pregnant.  Take 400 to 800 micrograms (mcg) of folic acid every day if you become pregnant.  Ask for birth control (contraception) if you want to prevent pregnancy. Osteoporosis and menopause Osteoporosis is a disease in which the bones lose minerals and strength with aging. This can result in bone fractures. If you are 15 years old or older, or if you are at risk for osteoporosis and fractures, ask your health care provider if you should:  Be screened for bone loss.  Take a calcium or vitamin D supplement to lower your risk of fractures.  Be given hormone replacement therapy (HRT) to treat symptoms of menopause. Follow these instructions at home: Lifestyle  Do not use any products that contain nicotine or tobacco, such as cigarettes, e-cigarettes, and chewing tobacco. If you need help quitting, ask your health care provider.  Do not use street drugs.  Do not share needles.  Ask your health care provider for help if you need support or information about quitting drugs. Alcohol use  Do not drink alcohol if: ? Your health care provider tells you not to drink. ? You are pregnant, may be pregnant, or are planning to become pregnant.  If you drink alcohol: ? Limit how much you use to 0-1 drink a day. ? Limit intake if you are breastfeeding.  Be aware of how much alcohol is in your drink. In the [place] one drink equals one 12 oz bottle of beer (355 mL), one 5 oz glass of wine (148 mL), or one 1 oz glass of hard liquor (44 mL). General instructions  Schedule regular health, dental, and eye exams.  Stay current with your vaccines.  Tell your health care provider if: ? You often feel depressed. ? You have ever been abused or do not feel safe at home. Summary  Adopting a healthy lifestyle and getting preventive care are important in promoting health and  wellness.  Follow your health care provider's instructions about healthy diet, exercising, and getting tested or screened for diseases.  Follow your health care provider's instructions on monitoring your cholesterol and blood pressure. This information is not intended to replace advice given to you by your health care provider. Make sure you discuss any questions you have with your health care provider. Document Revised: 05/05/2018 Document Reviewed: 05/05/2018 Elsevier Patient Education  2021 [employer].

## 2020-08-28 LAB — CBC
Hematocrit: 38.8 % (ref 34.0–46.6)
Hemoglobin: 13.3 g/dL (ref 11.1–15.9)
MCH: 30.2 pg (ref 26.6–33.0)
MCHC: 34.3 g/dL (ref 31.5–35.7)
MCV: 88 fL (ref 79–97)
Platelets: 225 10*3/uL (ref 150–450)
RBC: 4.4 x10E6/uL (ref 3.77–5.28)
RDW: 12.8 % (ref 11.7–15.4)
WBC: 5.6 10*3/uL (ref 3.4–10.8)

## 2020-08-28 LAB — LIPID PANEL
Chol/HDL Ratio: 2.9 ratio (ref 0.0–4.4)
Cholesterol, Total: 183 mg/dL (ref 100–199)
HDL: 64 mg/dL (ref >39)
LDL Chol Calc (NIH): 101 mg/dL — ABNORMAL HIGH (ref 0–99)
Triglycerides: 102 mg/dL (ref 0–149)
VLDL Cholesterol Cal: 18 mg/dL (ref 5–40)

## 2020-08-28 LAB — CMP14+EGFR
ALT: 10 IU/L (ref 0–32)
AST: 21 IU/L (ref 0–40)
Albumin/Globulin Ratio: 0.8 — ABNORMAL LOW (ref 1.2–2.2)
Albumin: 3.7 g/dL — ABNORMAL LOW (ref 3.8–4.8)
Alkaline Phosphatase: 82 IU/L (ref 44–121)
BUN/Creatinine Ratio: 11 — ABNORMAL LOW (ref 12–28)
BUN: 11 mg/dL (ref 8–27)
Bilirubin Total: 0.3 mg/dL (ref 0.0–1.2)
CO2: 26 mmol/L (ref 20–29)
Calcium: 9.4 mg/dL (ref 8.7–10.3)
Chloride: 99 mmol/L (ref 96–106)
Creatinine, Ser: 0.97 mg/dL (ref 0.57–1.00)
Globulin, Total: 4.9 g/dL — ABNORMAL HIGH (ref 1.5–4.5)
Glucose: 115 mg/dL — ABNORMAL HIGH (ref 65–99)
Potassium: 4 mmol/L (ref 3.5–5.2)
Sodium: 140 mmol/L (ref 134–144)
Total Protein: 8.6 g/dL — ABNORMAL HIGH (ref 6.0–8.5)
eGFR: 66 mL/min/{1.73_m2} (ref >59)

## 2020-08-28 LAB — T4, FREE: Free T4: 1.48 ng/dL (ref 0.82–1.77)

## 2020-08-28 LAB — MAGNESIUM: Magnesium: 2 mg/dL (ref 1.6–2.3)

## 2020-08-28 LAB — VITAMIN B12: Vitamin B-12: 374 pg/mL (ref 232–1245)

## 2020-08-28 LAB — HEMOGLOBIN A1C
Est. average glucose Bld gHb Est-mCnc: 128 mg/dL
Hgb A1c MFr Bld: 6.1 % — ABNORMAL HIGH (ref 4.8–5.6)

## 2020-08-28 LAB — TSH: TSH: 0.665 u[IU]/mL (ref 0.450–4.500)

## 2020-08-31 ENCOUNTER — Encounter: Payer: Self-pay | Admitting: Internal Medicine

## 2020-09-03 LAB — SPECIMEN STATUS REPORT

## 2020-09-03 LAB — PROTEIN ELECTROPHORESIS
A/G Ratio: 0.8 (ref 0.7–1.7)
Albumin ELP: 3.8 g/dL (ref 2.9–4.4)
Alpha 1: 0.2 g/dL (ref 0.0–0.4)
Alpha 2: 0.6 g/dL (ref 0.4–1.0)
Beta: 1.2 g/dL (ref 0.7–1.3)
Gamma Globulin: 2.5 g/dL — ABNORMAL HIGH (ref 0.4–1.8)
Globulin, Total: 4.6 g/dL — ABNORMAL HIGH (ref 2.2–3.9)
Total Protein: 8.4 g/dL (ref 6.0–8.5)

## 2020-09-17 ENCOUNTER — Other Ambulatory Visit: Payer: Self-pay | Admitting: Internal Medicine

## 2020-10-09 ENCOUNTER — Other Ambulatory Visit: Payer: Self-pay

## 2020-10-09 ENCOUNTER — Ambulatory Visit: Payer: 59

## 2020-10-09 VITALS — BP 122/70 | HR 92 | Temp 98.4°F | Ht 67.0 in | Wt 221.0 lb

## 2020-10-09 DIAGNOSIS — R000 Tachycardia, unspecified: Secondary | ICD-10-CM

## 2020-10-09 DIAGNOSIS — E538 Deficiency of other specified B group vitamins: Secondary | ICD-10-CM

## 2020-10-09 MED ORDER — CYANOCOBALAMIN 1000 MCG/ML IJ SOLN: 1000.0000 ug | Freq: Once | INTRAMUSCULAR | Status: DC

## 2020-10-09 NOTE — Progress Notes (Signed)
Patient presents today for nurse visit to recheck her heart rate at her last visit on 08/27/20 it was 115 and today it was 92. She also received a vitamin B12 through [facility]. YL,RMA

## 2020-10-29 ENCOUNTER — Encounter: Payer: Self-pay | Admitting: Internal Medicine

## 2020-11-07 ENCOUNTER — Encounter: Payer: Self-pay | Admitting: Internal Medicine

## 2020-11-12 ENCOUNTER — Other Ambulatory Visit: Payer: Self-pay | Admitting: Internal Medicine

## 2020-11-19 ENCOUNTER — Encounter: Payer: Self-pay | Admitting: Internal Medicine

## 2020-11-20 LAB — HM COLONOSCOPY

## 2020-11-21 ENCOUNTER — Encounter: Payer: Self-pay | Admitting: Internal Medicine

## 2020-12-12 LAB — HM COLONOSCOPY

## 2020-12-19 ENCOUNTER — Encounter: Payer: Self-pay | Admitting: Internal Medicine

## 2020-12-28 ENCOUNTER — Encounter: Payer: Self-pay | Admitting: Internal Medicine

## 2021-02-10 ENCOUNTER — Other Ambulatory Visit: Payer: Self-pay | Admitting: Internal Medicine

## 2021-02-16 ENCOUNTER — Encounter: Payer: Self-pay | Admitting: Internal Medicine

## 2021-03-04 ENCOUNTER — Ambulatory Visit: Payer: 59 | Admitting: Internal Medicine

## 2021-03-04 ENCOUNTER — Encounter: Payer: Self-pay | Admitting: Internal Medicine

## 2021-03-04 ENCOUNTER — Other Ambulatory Visit: Payer: Self-pay

## 2021-03-04 VITALS — BP 122/66 | HR 102 | Temp 97.7°F | Ht 67.4 in | Wt 217.2 lb

## 2021-03-04 DIAGNOSIS — E538 Deficiency of other specified B group vitamins: Secondary | ICD-10-CM

## 2021-03-04 DIAGNOSIS — Z23 Encounter for immunization: Secondary | ICD-10-CM

## 2021-03-04 DIAGNOSIS — R7309 Other abnormal glucose: Secondary | ICD-10-CM | POA: Diagnosis not present

## 2021-03-04 DIAGNOSIS — Z6833 Body mass index (BMI) 33.0-33.9, adult: Secondary | ICD-10-CM

## 2021-03-04 DIAGNOSIS — E890 Postprocedural hypothyroidism: Secondary | ICD-10-CM

## 2021-03-04 DIAGNOSIS — Z1231 Encounter for screening mammogram for malignant neoplasm of breast: Secondary | ICD-10-CM

## 2021-03-04 DIAGNOSIS — I10 Essential (primary) hypertension: Secondary | ICD-10-CM | POA: Diagnosis not present

## 2021-03-04 DIAGNOSIS — E6609 Other obesity due to excess calories: Secondary | ICD-10-CM

## 2021-03-04 MED ORDER — VALSARTAN-HYDROCHLOROTHIAZIDE 160-12.5 MG PO TABS
1.0000 | ORAL_TABLET | Freq: Every day | ORAL | 1 refills | Status: DC
Start: 2021-03-04 — End: 2021-09-11

## 2021-03-04 NOTE — Progress Notes (Signed)
I,Tianna Badgett,acting as a Education administrator for Maximino Greenland, MD.,have documented all relevant documentation on the behalf of Maximino Greenland, MD,as directed by  Maximino Greenland, MD while in the presence of Maximino Greenland, MD.  This visit occurred during the SARS-CoV-2 public health emergency.  Safety protocols were in place, including screening questions prior to the visit, additional usage of staff PPE, and extensive cleaning of exam room while observing appropriate contact time as indicated for disinfecting solutions.  Subjective:     Patient ID: Julia Richards , female    DOB: 08/06/1955 , 65 y.o.   MRN: [id number]   Chief Complaint  Patient presents with   Hypertension    HPI  She is here today for a HTN and thyroid follow up. She has no specific concerns or complaints at this time. She denies headaches, chest pain and shortness of breath. She also denies palpitations, diarrhea and unintentional weight loss.     Hypertension This is a chronic problem. The current episode started more than 1 year ago. The problem has been gradually improving since onset. The problem is controlled. Pertinent negatives include no blurred vision, chest pain, palpitations or shortness of breath. Risk factors for coronary artery disease include obesity, sedentary lifestyle and post-menopausal state. Past treatments include angiotensin blockers and diuretics.    Past Medical History:  Diagnosis Date   Abnormal pap    Allergy    Anemia    Bacterial vaginosis    Cervicitis    Colonic polyp    Endometriosis    Fibroids    Glaucoma    Hypertension    Hypothyroidism    Vulvitis      Family History  Problem Relation Age of Onset   Hypertension Mother    Cancer Mother        stomach   Kidney disease Father    Diabetes Maternal Aunt    Diabetes Maternal Uncle    Hypertension Sister    Stroke Maternal Uncle      Current Outpatient Medications:    bimatoprost (LUMIGAN) 0.01 % SOLN, 1 drop at  bedtime., Disp: , Rfl:    brimonidine (ALPHAGAN P) 0.1 % SOLN, INSTILL 1 DROP IN BOTH EYES THREE TIMES DAILY, Disp: , Rfl:    calcium carbonate (TUMS EX) 750 MG chewable tablet, Chew 1 tablet by mouth daily., Disp: , Rfl:    Cholecalciferol (VITAMIN D3) 125 MCG (5000 UT) TBDP, Take by mouth., Disp: , Rfl:    dorzolamide-timolol (COSOPT) 22.3-6.8 MG/ML ophthalmic solution, 1 drop 2 (two) times daily., Disp: , Rfl:    levothyroxine (SYNTHROID) 88 MCG tablet, TAKE 1 TABLET BY MOUTH EVERY DAY, Disp: 30 tablet, Rfl: 2   Netarsudil Dimesylate 0.02 % SOLN, Apply to eye., Disp: , Rfl:    pilocarpine (PILOCAR) 4 % ophthalmic solution, 1 drop 4 (four) times daily., Disp: , Rfl:    valsartan-hydrochlorothiazide (DIOVAN-HCT) 160-12.5 MG tablet, Take 1 tablet by mouth daily., Disp: 90 tablet, Rfl: 1   Allergies  Allergen Reactions   Amoxicillin    Codeine Itching   Penicillins Hives and Itching   Tetracyclines & Related      Review of Systems  Constitutional: Negative.   Eyes:  Negative for blurred vision.  Respiratory: Negative.  Negative for chest tightness and shortness of breath.   Cardiovascular: Negative.  Negative for chest pain and palpitations.  Gastrointestinal: Negative.   Neurological: Negative.     Today's Vitals   03/04/21 1538  BP: 122/66  Pulse: (!) 102  Temp: 97.7 F (36.5 C)  TempSrc: Oral  Weight: 217 lb 3.2 oz (98.5 kg)  Height: 5' 7.4" (1.712 m)   Body mass index is 33.62 kg/m.   Wt Readings from Last 3 Encounters:  03/04/21 217 lb 3.2 oz (98.5 kg)  10/09/20 221 lb (100.2 kg)  08/27/20 221 lb 9.6 oz (100.5 kg)      Objective:  Physical Exam Vitals and nursing note reviewed.  Constitutional:      Appearance: Normal appearance.  HENT:     Head: Normocephalic and atraumatic.     Nose:     Comments: Masked     Mouth/Throat:     Comments: Masked  Eyes:     Extraocular Movements: Extraocular movements intact.  Cardiovascular:     Rate and Rhythm: Regular  rhythm. Tachycardia present.     Heart sounds: Normal heart sounds.  Pulmonary:     Effort: Pulmonary effort is normal.     Breath sounds: Normal breath sounds.  Musculoskeletal:     Cervical back: Normal range of motion.  Skin:    General: Skin is warm.  Neurological:     General: No focal deficit present.     Mental Status: She is alert.  Psychiatric:        Mood and Affect: Mood normal.        Behavior: Behavior normal.        Assessment And Plan:     1. Essential hypertension Comments: Chronic, well controlled. I will check renal function today.  She will rto in six months for her CPE.  - CMP14+EGFR  2. Postablative hypothyroidism Comments: Unfortunately, regimen did not get adjusted after last visit. I will check a thyroid panel today. I will refer her back to Dr. Chalmers Cater as requested for medication management.  - TSH - T4, Free - Ambulatory referral to Endocrinology  3. Other abnormal glucose Comments: Her a1c has been up in the past. I will recheck this today.  She is encouraged to limit her intake of sweetened beverages, including diet drinks.  - Hemoglobin A1c  4. Vitamin B12 deficiency Comments: I will check vitamin B12 level today.  Her last B12 injection was about two weeks ago.  - Vitamin B12  5. Class 1 obesity due to excess calories with serious comorbidity and body mass index (BMI) of 33.0 to 33.9 in adult Comments: She was congratulated on her 4 lb weight loss since her last visit. She is encouraged to strive for BMI less than 30 to decrease cardiac risk.   6. Need for influenza vaccination Comments: She was given flu vaccine to update her immunization daily.  - Flu Vaccine QUAD 6+ mos PF IM (Fluarix Quad PF)  7. Encounter for screening mammogram for malignant neoplasm of breast Comments: I will refer her to [facility] for breast cancer screening and GYN exam. She is followed by Dr. Alwyn Pea.  - Ambulatory referral to Gynecology   Patient was given opportunity to  ask questions. Patient verbalized understanding of the plan and was able to repeat key elements of the plan. All questions were answered to their satisfaction.   I, Maximino Greenland, MD, have reviewed all documentation for this visit. The documentation on 03/04/21 for the exam, diagnosis, procedures, and orders are all accurate and complete.   IF YOU HAVE BEEN REFERRED TO A SPECIALIST, IT MAY TAKE 1-2 WEEKS TO SCHEDULE/PROCESS THE REFERRAL. IF YOU HAVE NOT HEARD FROM US/SPECIALIST IN TWO WEEKS, PLEASE GIVE  [place] A CALL AT [phone] X [phone].   THE PATIENT IS ENCOURAGED TO PRACTICE SOCIAL DISTANCING DUE TO THE COVID-19 PANDEMIC.

## 2021-03-04 NOTE — Patient Instructions (Addendum)
Magnesium glycinate powder 125-250mg  nightly (You can order from [employer])  Hypertension, Adult High blood pressure (hypertension) is when the force of blood pumping through the arteries is too strong. The arteries are the blood vessels that carry blood from the heart throughout the body. Hypertension forces the heart to work harder to pump blood and may cause arteries to become narrow or stiff. Untreated or uncontrolled hypertension can cause a heart attack, heart failure, a stroke, kidney disease, and other problems. A blood pressure reading consists of a higher number over a lower number. Ideally, your blood pressure should be below 120/80. The first ("top") number is called the systolic pressure. It is a measure of the pressure in your arteries as your heart beats. The second ("bottom") number is called the diastolic pressure. It is a measure of the pressure in your arteries as the heart relaxes. What are the causes? The exact cause of this condition is not known. There are some conditions that result in or are related to high blood pressure. What increases the risk? Some risk factors for high blood pressure are under your control. The following factors may make you more likely to develop this condition: Smoking. Having type 2 diabetes mellitus, high cholesterol, or both. Not getting enough exercise or physical activity. Being overweight. Having too much fat, sugar, calories, or salt (sodium) in your diet. Drinking too much alcohol. Some risk factors for high blood pressure may be difficult or impossible to change. Some of these factors include: Having chronic kidney disease. Having a family history of high blood pressure. Age. Risk increases with age. Race. You may be at higher risk if you are African American. Gender. Men are at higher risk than women before age 93. After age 26, women are at higher risk than men. Having obstructive sleep apnea. Stress. What are the signs or  symptoms? High blood pressure may not cause symptoms. Very high blood pressure (hypertensive crisis) may cause: Headache. Anxiety. Shortness of breath. Nosebleed. Nausea and vomiting. Vision changes. Severe chest pain. Seizures. How is this diagnosed? This condition is diagnosed by measuring your blood pressure while you are seated, with your arm resting on a flat surface, your legs uncrossed, and your feet flat on the floor. The cuff of the blood pressure monitor will be placed directly against the skin of your upper arm at the level of your heart. It should be measured at least twice using the same arm. Certain conditions can cause a difference in blood pressure between your right and left arms. Certain factors can cause blood pressure readings to be lower or higher than normal for a short period of time: When your blood pressure is higher when you are in a health care provider's office than when you are at home, this is called white coat hypertension. Most people with this condition do not need medicines. When your blood pressure is higher at home than when you are in a health care provider's office, this is called masked hypertension. Most people with this condition may need medicines to control blood pressure. If you have a high blood pressure reading during one visit or you have normal blood pressure with other risk factors, you may be asked to: Return on a different day to have your blood pressure checked again. Monitor your blood pressure at home for 1 week or longer. If you are diagnosed with hypertension, you may have other blood or imaging tests to help your health care provider understand your overall risk for other  conditions. How is this treated? This condition is treated by making healthy lifestyle changes, such as eating healthy foods, exercising more, and reducing your alcohol intake. Your health care provider may prescribe medicine if lifestyle changes are not enough to get your  blood pressure under control, and if: Your systolic blood pressure is above 130. Your diastolic blood pressure is above 80. Your personal target blood pressure may vary depending on your medical conditions, your age, and other factors. Follow these instructions at home: Eating and drinking  Eat a diet that is high in fiber and potassium, and low in sodium, added sugar, and fat. An example eating plan is called the DASH (Dietary Approaches to Stop Hypertension) diet. To eat this way: Eat plenty of fresh fruits and vegetables. Try to fill one half of your plate at each meal with fruits and vegetables. Eat whole grains, such as whole-wheat pasta, brown rice, or whole-grain bread. Fill about one fourth of your plate with whole grains. Eat or drink low-fat dairy products, such as skim milk or low-fat yogurt. Avoid fatty cuts of meat, processed or cured meats, and poultry with skin. Fill about one fourth of your plate with lean proteins, such as fish, chicken without skin, beans, eggs, or tofu. Avoid pre-made and processed foods. These tend to be higher in sodium, added sugar, and fat. Reduce your daily sodium intake. Most people with hypertension should eat less than 1,500 mg of sodium a day. Do not drink alcohol if: Your health care provider tells you not to drink. You are pregnant, may be pregnant, or are planning to become pregnant. If you drink alcohol: Limit how much you use to: 0-1 drink a day for women. 0-2 drinks a day for men. Be aware of how much alcohol is in your drink. In the [place] one drink equals one 12 oz bottle of beer (355 mL), one 5 oz glass of wine (148 mL), or one 1 oz glass of hard liquor (44 mL). Lifestyle  Work with your health care provider to maintain a healthy body weight or to lose weight. Ask what an ideal weight is for you. Get at least 30 minutes of exercise most days of the week. Activities may include walking, swimming, or biking. Include exercise to strengthen  your muscles (resistance exercise), such as Pilates or lifting weights, as part of your weekly exercise routine. Try to do these types of exercises for 30 minutes at least 3 days a week. Do not use any products that contain nicotine or tobacco, such as cigarettes, e-cigarettes, and chewing tobacco. If you need help quitting, ask your health care provider. Monitor your blood pressure at home as told by your health care provider. Keep all follow-up visits as told by your health care provider. This is important. Medicines Take over-the-counter and prescription medicines only as told by your health care provider. Follow directions carefully. Blood pressure medicines must be taken as prescribed. Do not skip doses of blood pressure medicine. Doing this puts you at risk for problems and can make the medicine less effective. Ask your health care provider about side effects or reactions to medicines that you should watch for. Contact a health care provider if you: Think you are having a reaction to a medicine you are taking. Have headaches that keep coming back (recurring). Feel dizzy. Have swelling in your ankles. Have trouble with your vision. Get help right away if you: Develop a severe headache or confusion. Have unusual weakness or numbness. Feel faint. Have  severe pain in your chest or abdomen. Vomit repeatedly. Have trouble breathing. Summary Hypertension is when the force of blood pumping through your arteries is too strong. If this condition is not controlled, it may put you at risk for serious complications. Your personal target blood pressure may vary depending on your medical conditions, your age, and other factors. For most people, a normal blood pressure is less than 120/80. Hypertension is treated with lifestyle changes, medicines, or a combination of both. Lifestyle changes include losing weight, eating a healthy, low-sodium diet, exercising more, and limiting alcohol. This  information is not intended to replace advice given to you by your health care provider. Make sure you discuss any questions you have with your health care provider. Document Revised: 01/20/2018 Document Reviewed: 01/20/2018 Elsevier Patient Education  [employer].

## 2021-03-05 LAB — CMP14+EGFR
ALT: 7 IU/L (ref 0–32)
AST: 18 IU/L (ref 0–40)
Albumin/Globulin Ratio: 0.8 — ABNORMAL LOW (ref 1.2–2.2)
Albumin: 4 g/dL (ref 3.8–4.8)
Alkaline Phosphatase: 77 IU/L (ref 44–121)
BUN/Creatinine Ratio: 13 (ref 12–28)
BUN: 13 mg/dL (ref 8–27)
Bilirubin Total: 0.4 mg/dL (ref 0.0–1.2)
CO2: 26 mmol/L (ref 20–29)
Calcium: 9.6 mg/dL (ref 8.7–10.3)
Chloride: 100 mmol/L (ref 96–106)
Creatinine, Ser: 1.04 mg/dL — ABNORMAL HIGH (ref 0.57–1.00)
Globulin, Total: 4.8 g/dL — ABNORMAL HIGH (ref 1.5–4.5)
Glucose: 110 mg/dL — ABNORMAL HIGH (ref 70–99)
Potassium: 4.2 mmol/L (ref 3.5–5.2)
Sodium: 140 mmol/L (ref 134–144)
Total Protein: 8.8 g/dL — ABNORMAL HIGH (ref 6.0–8.5)
eGFR: 60 mL/min/{1.73_m2} (ref >59)

## 2021-03-05 LAB — T4, FREE: Free T4: 1.45 ng/dL (ref 0.82–1.77)

## 2021-03-05 LAB — VITAMIN B12: Vitamin B-12: 449 pg/mL (ref 232–1245)

## 2021-03-05 LAB — HEMOGLOBIN A1C
Est. average glucose Bld gHb Est-mCnc: 126 mg/dL
Hgb A1c MFr Bld: 6 % — ABNORMAL HIGH (ref 4.8–5.6)

## 2021-03-05 LAB — TSH: TSH: 0.66 u[IU]/mL (ref 0.450–4.500)

## 2021-03-08 ENCOUNTER — Encounter: Payer: Self-pay | Admitting: Internal Medicine

## 2021-06-02 ENCOUNTER — Encounter: Payer: Self-pay | Admitting: Internal Medicine

## 2021-07-29 ENCOUNTER — Encounter: Payer: Self-pay | Admitting: Internal Medicine

## 2021-07-31 ENCOUNTER — Encounter: Payer: Self-pay | Admitting: Internal Medicine

## 2021-09-03 ENCOUNTER — Ambulatory Visit (INDEPENDENT_AMBULATORY_CARE_PROVIDER_SITE_OTHER): Payer: 59 | Admitting: Internal Medicine

## 2021-09-03 ENCOUNTER — Encounter: Payer: Self-pay | Admitting: Internal Medicine

## 2021-09-03 VITALS — BP 118/68 | Temp 97.8°F | Ht 67.4 in | Wt 206.4 lb

## 2021-09-03 DIAGNOSIS — Z0000 Encounter for general adult medical examination without abnormal findings: Secondary | ICD-10-CM | POA: Diagnosis not present

## 2021-09-03 DIAGNOSIS — E559 Vitamin D deficiency, unspecified: Secondary | ICD-10-CM

## 2021-09-03 DIAGNOSIS — I10 Essential (primary) hypertension: Secondary | ICD-10-CM

## 2021-09-03 DIAGNOSIS — E6609 Other obesity due to excess calories: Secondary | ICD-10-CM

## 2021-09-03 DIAGNOSIS — E890 Postprocedural hypothyroidism: Secondary | ICD-10-CM

## 2021-09-03 DIAGNOSIS — R7309 Other abnormal glucose: Secondary | ICD-10-CM

## 2021-09-03 DIAGNOSIS — Z23 Encounter for immunization: Secondary | ICD-10-CM

## 2021-09-03 DIAGNOSIS — Z6831 Body mass index (BMI) 31.0-31.9, adult: Secondary | ICD-10-CM

## 2021-09-03 DIAGNOSIS — R8290 Unspecified abnormal findings in urine: Secondary | ICD-10-CM

## 2021-09-03 DIAGNOSIS — E538 Deficiency of other specified B group vitamins: Secondary | ICD-10-CM

## 2021-09-03 LAB — POCT URINALYSIS DIPSTICK
Bilirubin, UA: NEGATIVE
Glucose, UA: NEGATIVE
Ketones, UA: NEGATIVE
Nitrite, UA: NEGATIVE
Protein, UA: NEGATIVE
Spec Grav, UA: 1.01 (ref 1.010–1.025)
Urobilinogen, UA: 1 E.U./dL
pH, UA: 6 (ref 5.0–8.0)

## 2021-09-03 MED ORDER — TETANUS-DIPHTH-ACELL PERTUSSIS 5-2.5-18.5 LF-MCG/0.5 IM SUSY
0.5000 mL | PREFILLED_SYRINGE | Freq: Once | INTRAMUSCULAR | Status: AC
  Administered 2021-09-03: 0.5 mL via INTRAMUSCULAR

## 2021-09-03 MED ORDER — LEVOTHYROXINE SODIUM 75 MCG PO CAPS: 75.0000 ug | ORAL_CAPSULE | Freq: Every day | ORAL | 0 refills | Status: AC

## 2021-09-03 NOTE — Progress Notes (Signed)
?Rich Brave Llittleton,acting as a Education administrator for Maximino Greenland, MD.,have documented all relevant documentation on the behalf of Maximino Greenland, MD,as directed by  Maximino Greenland, MD while in the presence of Maximino Greenland, MD.  ?This visit occurred during the SARS-CoV-2 public health emergency.  Safety protocols were in place, including screening questions prior to the visit, additional usage of staff PPE, and extensive cleaning of exam room while observing appropriate contact time as indicated for disinfecting solutions. ? ?Subjective:  ?  ? Patient ID: Julia Richards , female    DOB: 1956/01/01 , 66 y.o.   MRN: [id number] ? ? ?Chief Complaint  ?Patient presents with  ? Annual Exam  ? Hypertension  ? ? ?HPI ? ?She is here today for a full physical examination.She is followed at Dr. Alwyn Pea at [facility]. She has no specific concerns or complaints at this time.  ? ?Hypertension ?This is a chronic problem. The current episode started more than 1 year ago. The problem has been gradually improving since onset. The problem is controlled. Pertinent negatives include no blurred vision, chest pain, palpitations or shortness of breath. Risk factors for coronary artery disease include obesity, sedentary lifestyle and post-menopausal state. Past treatments include angiotensin blockers and diuretics.   ? ?Past Medical History:  ?Diagnosis Date  ? Abnormal pap   ? Allergy   ? Anemia   ? Bacterial vaginosis   ? Cervicitis   ? Colonic polyp   ? Endometriosis   ? Fibroids   ? Glaucoma   ? Hypertension   ? Hypothyroidism   ? Vulvitis   ?  ? ?Family History  ?Problem Relation Age of Onset  ? Hypertension Mother   ? Cancer Mother   ?     stomach  ? Kidney disease Father   ? Diabetes Maternal Aunt   ? Diabetes Maternal Uncle   ? Hypertension Sister   ? Stroke Maternal Uncle   ? ? ? ?Current Outpatient Medications:  ?  bimatoprost (LUMIGAN) 0.01 % SOLN, 1 drop at bedtime., Disp: , Rfl:  ?  brimonidine (ALPHAGAN P) 0.1 %  SOLN, INSTILL 1 DROP IN BOTH EYES THREE TIMES DAILY, Disp: , Rfl:  ?  calcium carbonate (TUMS EX) 750 MG chewable tablet, Chew 1 tablet by mouth daily., Disp: , Rfl:  ?  Cholecalciferol (VITAMIN D3) 125 MCG (5000 UT) TBDP, Take by mouth., Disp: , Rfl:  ?  dorzolamide-timolol (COSOPT) 22.3-6.8 MG/ML ophthalmic solution, 1 drop 2 (two) times daily., Disp: , Rfl:  ?  Levothyroxine Sodium 75 MCG CAPS, Take 1 capsule (75 mcg total) by mouth daily before breakfast., Disp: 90 capsule, Rfl: 0 ?  Netarsudil Dimesylate (RHOPRESSA) 0.02 % SOLN, 1 drop into affected eye in the evening, Disp: , Rfl:  ?  pilocarpine (PILOCAR) 4 % ophthalmic solution, 1 drop 4 (four) times daily., Disp: , Rfl:  ?  dorzolamide (TRUSOPT) 2 % ophthalmic solution, 1 drop into affected eye, Disp: , Rfl:  ?  valsartan-hydrochlorothiazide (DIOVAN-HCT) 160-12.5 MG tablet, TAKE 1 TABLET BY MOUTH DAILY, Disp: 90 tablet, Rfl: 1  ? ?Allergies  ?Allergen Reactions  ? Amoxicillin   ? Codeine Itching  ? Penicillins Hives and Itching  ? Tetracyclines & Related   ?  ? ? ?The patient states she uses post menopausal status for birth control. Last LMP was No LMP recorded. Patient is postmenopausal.. Negative for Dysmenorrhea. Negative for: breast discharge, breast lump(s), breast pain and breast self exam. Associated symptoms  include abnormal vaginal bleeding. Pertinent negatives include abnormal bleeding (hematology), anxiety, decreased libido, depression, difficulty falling sleep, dyspareunia, history of infertility, nocturia, sexual dysfunction, sleep disturbances, urinary incontinence, urinary urgency, vaginal discharge and vaginal itching. Diet regular.The patient states her exercise level is   ? Marland Kitchen The patient's tobacco use is:  ?Social History  ? ?Tobacco Use  ?Smoking Status Never  ?Smokeless Tobacco Never  ?Marland Kitchen She has been exposed to passive smoke. The patient's alcohol use is:  ?Social History  ? ?Substance and Sexual Activity  ?Alcohol Use Yes  ? ?Review of  Systems  ?Constitutional: Negative.   ?HENT: Negative.    ?Eyes: Negative.  Negative for blurred vision.  ?Respiratory: Negative.  Negative for shortness of breath.   ?Cardiovascular: Negative.  Negative for chest pain and palpitations.  ?Gastrointestinal: Negative.   ?Endocrine: Negative.   ?Genitourinary: Negative.   ?Musculoskeletal: Negative.   ?Skin: Negative.   ?Allergic/Immunologic: Negative.   ?Neurological: Negative.   ?Hematological: Negative.   ?Psychiatric/Behavioral: Negative.     ? ?Today's Vitals  ? 09/03/21 1457  ?BP: 118/68  ?Temp: 97.8 ?F (36.6 ?C)  ?Weight: 206 lb 6.4 oz (93.6 kg)  ?Height: 5' 7.4" (1.712 m)  ?PainSc: 0-No pain  ? ?Body mass index is 31.94 kg/m?.  ?Wt Readings from Last 3 Encounters:  ?09/03/21 206 lb 6.4 oz (93.6 kg)  ?03/04/21 217 lb 3.2 oz (98.5 kg)  ?10/09/20 221 lb (100.2 kg)  ?  ? ?Objective:  ?Physical Exam ?Vitals and nursing note reviewed.  ?Constitutional:   ?   Appearance: Normal appearance.  ?HENT:  ?   Head: Normocephalic and atraumatic.  ?   Right Ear: Tympanic membrane, ear canal and external ear normal.  ?   Left Ear: Tympanic membrane, ear canal and external ear normal.  ?Eyes:  ?   Extraocular Movements: Extraocular movements intact.  ?   Conjunctiva/sclera: Conjunctivae normal.  ?   Pupils: Pupils are equal, round, and reactive to light.  ?Cardiovascular:  ?   Rate and Rhythm: Normal rate and regular rhythm.  ?   Pulses: Normal pulses.  ?   Heart sounds: Normal heart sounds.  ?Pulmonary:  ?   Effort: Pulmonary effort is normal.  ?   Breath sounds: Normal breath sounds.  ?Chest:  ?Breasts: ?   Tanner Score is 5.  ?   Right: Normal.  ?   Left: Normal.  ?Abdominal:  ?   General: Abdomen is flat. Bowel sounds are normal.  ?   Palpations: Abdomen is soft.  ?Genitourinary: ?   Comments: deferred ?Musculoskeletal:     ?   General: Normal range of motion.  ?   Cervical back: Normal range of motion and neck supple.  ?Skin: ?   General: Skin is warm and dry.   ?Neurological:  ?   General: No focal deficit present.  ?   Mental Status: She is alert and oriented to person, place, and time.  ?Psychiatric:     ?   Mood and Affect: Mood normal.     ?   Behavior: Behavior normal.  ?    ?Assessment And Plan:  ?   ?1. Encounter for general adult medical examination w/o abnormal findings ?Comments: A full exam was performed. Importance of monthly self breast exams was discussed with the patient. PATIENT IS ADVISED TO GET 30-45 MINUTES REGULAR EXERCISE NO LESS THAN FOUR TO FIVE DAYS PER WEEK - BOTH WEIGHTBEARING EXERCISES AND AEROBIC ARE RECOMMENDED.  PATIENT IS ADVISED TO  FOLLOW A HEALTHY DIET WITH AT LEAST SIX FRUITS/VEGGIES PER DAY, DECREASE INTAKE OF RED MEAT, AND TO INCREASE FISH INTAKE TO TWO DAYS PER WEEK.  MEATS/FISH SHOULD NOT BE FRIED, BAKED OR BROILED IS PREFERABLE.  IT IS ALSO IMPORTANT TO CUT BACK ON YOUR SUGAR INTAKE. PLEASE AVOID ANYTHING WITH ADDED SUGAR, CORN SYRUP OR OTHER SWEETENERS. IF YOU MUST USE A SWEETENER, YOU CAN TRY STEVIA. IT IS ALSO IMPORTANT TO AVOID ARTIFICIALLY SWEETENERS AND DIET BEVERAGES. LASTLY, I SUGGEST WEARING SPF 50 SUNSCREEN ON EXPOSED PARTS AND ESPECIALLY WHEN IN THE DIRECT SUNLIGHT FOR AN EXTENDED PERIOD OF TIME.  PLEASE AVOID FAST FOOD RESTAURANTS AND INCREASE YOUR WATER INTAKE. ? ?- Lipid panel ? ?2. Essential hypertension ?Comments: Chronic, well controlled. No need for med changes. She will c/w valsartan/hctz 160/12.18m once daily. She is encouraged to follow low sodium diet. EKG performed, NSR w/o acute changes. F/u 6 mos.  ?- POCT Urinalysis Dipstick (81002) ?- Microalbumin / Creatinine Urine Ratio ?- EKG 12-Lead ?- CBC ?- CMP14+EGFR ? ?3. Postablative hypothyroidism ?Comments: She is also followed by Dr. BChalmers Cater  I will request recent notes.  She will c/w levothyroxine 761m daily.  ? ?4. Other abnormal glucose ?Comments: Her a1c has been elevated. Advised to avoid sugary beverages, including diet drinks.  ?- Hemoglobin A1c ? ?5.  Vitamin B12 deficiency ?Comments: I will check vitamin B12 level today. She will c/w injectable vitamin B12 supplementation.  ?- Vitamin B12 ? ?6. Vitamin D deficiency ?Comments: I will check vitamin D level and suppleme

## 2021-09-03 NOTE — Patient Instructions (Signed)

## 2021-09-05 ENCOUNTER — Encounter: Payer: Self-pay | Admitting: Internal Medicine

## 2021-09-05 LAB — URINE CULTURE

## 2021-09-06 LAB — CMP14+EGFR
ALT: 10 IU/L (ref 0–32)
AST: 18 IU/L (ref 0–40)
Albumin/Globulin Ratio: 0.9 — ABNORMAL LOW (ref 1.2–2.2)
Albumin: 4 g/dL (ref 3.8–4.8)
Alkaline Phosphatase: 77 IU/L (ref 44–121)
BUN/Creatinine Ratio: 12 (ref 12–28)
BUN: 11 mg/dL (ref 8–27)
Bilirubin Total: 0.4 mg/dL (ref 0.0–1.2)
CO2: 30 mmol/L — ABNORMAL HIGH (ref 20–29)
Calcium: 9.4 mg/dL (ref 8.7–10.3)
Chloride: 101 mmol/L (ref 96–106)
Creatinine, Ser: 0.95 mg/dL (ref 0.57–1.00)
Globulin, Total: 4.5 g/dL (ref 1.5–4.5)
Glucose: 84 mg/dL (ref 70–99)
Potassium: 4.1 mmol/L (ref 3.5–5.2)
Sodium: 143 mmol/L (ref 134–144)
Total Protein: 8.5 g/dL (ref 6.0–8.5)
eGFR: 67 mL/min/{1.73_m2} (ref >59)

## 2021-09-06 LAB — LIPID PANEL
Chol/HDL Ratio: 3 ratio (ref 0.0–4.4)
Cholesterol, Total: 193 mg/dL (ref 100–199)
HDL: 65 mg/dL (ref >39)
LDL Chol Calc (NIH): 111 mg/dL — ABNORMAL HIGH (ref 0–99)
Triglycerides: 93 mg/dL (ref 0–149)
VLDL Cholesterol Cal: 17 mg/dL (ref 5–40)

## 2021-09-06 LAB — MICROALBUMIN / CREATININE URINE RATIO
Creatinine, Urine: 54.8 mg/dL
Microalb/Creat Ratio: <5 mg/g creat (ref 0–29)
Microalbumin, Urine: <3 ug/mL

## 2021-09-06 LAB — CBC
Hematocrit: 39.3 % (ref 34.0–46.6)
Hemoglobin: 13.3 g/dL (ref 11.1–15.9)
MCH: 30.2 pg (ref 26.6–33.0)
MCHC: 33.8 g/dL (ref 31.5–35.7)
MCV: 89 fL (ref 79–97)
Platelets: 212 10*3/uL (ref 150–450)
RBC: 4.41 x10E6/uL (ref 3.77–5.28)
RDW: 12.7 % (ref 11.7–15.4)
WBC: 5.5 10*3/uL (ref 3.4–10.8)

## 2021-09-06 LAB — VITAMIN B12: Vitamin B-12: 315 pg/mL (ref 232–1245)

## 2021-09-06 LAB — VITAMIN D 25 HYDROXY (VIT D DEFICIENCY, FRACTURES): Vit D, 25-Hydroxy: 29.8 ng/mL — ABNORMAL LOW (ref 30.0–100.0)

## 2021-09-06 LAB — HEMOGLOBIN A1C
Est. average glucose Bld gHb Est-mCnc: 126 mg/dL
Hgb A1c MFr Bld: 6 % — ABNORMAL HIGH (ref 4.8–5.6)

## 2021-09-11 ENCOUNTER — Other Ambulatory Visit: Payer: Self-pay | Admitting: Internal Medicine

## 2021-10-01 ENCOUNTER — Encounter: Payer: Self-pay | Admitting: Internal Medicine

## 2021-10-27 ENCOUNTER — Encounter: Payer: Self-pay | Admitting: Internal Medicine

## 2021-11-05 ENCOUNTER — Ambulatory Visit: Payer: 59

## 2021-12-10 ENCOUNTER — Encounter: Payer: Self-pay | Admitting: Internal Medicine

## 2021-12-11 ENCOUNTER — Other Ambulatory Visit: Payer: Self-pay | Admitting: Internal Medicine

## 2022-02-17 ENCOUNTER — Encounter: Payer: Self-pay | Admitting: Internal Medicine

## 2022-03-04 ENCOUNTER — Encounter: Payer: Self-pay | Admitting: Internal Medicine

## 2022-03-05 ENCOUNTER — Encounter: Payer: Self-pay | Admitting: Internal Medicine

## 2022-03-05 ENCOUNTER — Ambulatory Visit: Payer: No Typology Code available for payment source | Admitting: Internal Medicine

## 2022-03-05 VITALS — BP 132/80 | HR 109 | Temp 98.4°F | Ht 67.4 in | Wt 209.2 lb

## 2022-03-05 DIAGNOSIS — Z6832 Body mass index (BMI) 32.0-32.9, adult: Secondary | ICD-10-CM

## 2022-03-05 DIAGNOSIS — Z23 Encounter for immunization: Secondary | ICD-10-CM | POA: Diagnosis not present

## 2022-03-05 DIAGNOSIS — Z780 Asymptomatic menopausal state: Secondary | ICD-10-CM

## 2022-03-05 DIAGNOSIS — E890 Postprocedural hypothyroidism: Secondary | ICD-10-CM

## 2022-03-05 DIAGNOSIS — E7800 Pure hypercholesterolemia, unspecified: Secondary | ICD-10-CM

## 2022-03-05 DIAGNOSIS — E6609 Other obesity due to excess calories: Secondary | ICD-10-CM

## 2022-03-05 DIAGNOSIS — R7309 Other abnormal glucose: Secondary | ICD-10-CM | POA: Diagnosis not present

## 2022-03-05 DIAGNOSIS — I10 Essential (primary) hypertension: Secondary | ICD-10-CM

## 2022-03-05 DIAGNOSIS — E538 Deficiency of other specified B group vitamins: Secondary | ICD-10-CM

## 2022-03-05 NOTE — Progress Notes (Signed)
Rich Brave Llittleton,acting as a Education administrator for Maximino Greenland, MD.,have documented all relevant documentation on the behalf of Maximino Greenland, MD,as directed by  Maximino Greenland, MD while in the presence of Maximino Greenland, MD.    Subjective:     Patient ID: Julia Richards , female    DOB: [birth year] , 66 y.o.   MRN: [id number]   Chief Complaint  Patient presents with   Hypertension   Hypothyroidism    HPI  She is here today for a HTN and thyroid follow up. She has no specific concerns or complaints at this time. She denies headaches, chest pain and shortness of breath.    Hypertension This is a chronic problem. The current episode started more than 1 year ago. The problem has been gradually improving since onset. The problem is controlled. Pertinent negatives include no blurred vision. Risk factors for coronary artery disease include obesity, sedentary lifestyle and post-menopausal state. Past treatments include angiotensin blockers and diuretics.     Past Medical History:  Diagnosis Date   Abnormal pap    Allergy    Anemia    Bacterial vaginosis    Cervicitis    Colonic polyp    Endometriosis    Fibroids    Glaucoma    Hypertension    Hypothyroidism    Vulvitis      Family History  Problem Relation Age of Onset   Hypertension Mother    Cancer Mother        stomach   Kidney disease Father    Diabetes Maternal Aunt    Diabetes Maternal Uncle    Hypertension Sister    Stroke Maternal Uncle      Current Outpatient Medications:    bimatoprost (LUMIGAN) 0.01 % SOLN, 1 drop at bedtime., Disp: , Rfl:    calcium carbonate (TUMS EX) 750 MG chewable tablet, Chew 1 tablet by mouth daily., Disp: , Rfl:    Cholecalciferol (VITAMIN D3) 125 MCG (5000 UT) TBDP, Take by mouth., Disp: , Rfl:    dorzolamide (TRUSOPT) 2 % ophthalmic solution, 1 drop into affected eye, Disp: , Rfl:    dorzolamide-timolol (COSOPT) 22.3-6.8 MG/ML ophthalmic solution, 1 drop 2 (two) times daily.,  Disp: , Rfl:    Levothyroxine Sodium 75 MCG CAPS, Take 1 capsule (75 mcg total) by mouth daily before breakfast., Disp: 90 capsule, Rfl: 0   Netarsudil Dimesylate (RHOPRESSA) 0.02 % SOLN, 1 drop into affected eye in the evening, Disp: , Rfl:    pilocarpine (PILOCAR) 4 % ophthalmic solution, 1 drop 4 (four) times daily., Disp: , Rfl:    valsartan-hydrochlorothiazide (DIOVAN-HCT) 160-12.5 MG tablet, TAKE 1 TABLET BY MOUTH DAILY, Disp: 90 tablet, Rfl: 1   brimonidine (ALPHAGAN) 0.2 % ophthalmic solution, Place 1 drop into the right eye 3 (three) times daily., Disp: , Rfl:    Allergies  Allergen Reactions   Amoxicillin    Codeine Itching   Penicillins Hives and Itching   Tetracyclines & Related      Review of Systems  Constitutional: Negative.   Eyes: Negative.  Negative for blurred vision.  Cardiovascular: Negative.   Musculoskeletal: Negative.   Skin: Negative.   Neurological: Negative.   Psychiatric/Behavioral: Negative.       Today's Vitals   03/05/22 1450  BP: 132/80  Pulse: (!) 109  Temp: 98.4 F (36.9 C)  Weight: 209 lb 3.2 oz (94.9 kg)  Height: 5' 7.4" (1.712 m)  PainSc: 0-No pain   Body mass index  is 32.38 kg/m.  Wt Readings from Last 3 Encounters:  03/05/22 209 lb 3.2 oz (94.9 kg)  09/03/21 206 lb 6.4 oz (93.6 kg)  03/04/21 217 lb 3.2 oz (98.5 kg)     Objective:  Physical Exam Vitals and nursing note reviewed.  Constitutional:      Appearance: Normal appearance.  HENT:     Head: Normocephalic and atraumatic.  Eyes:     Extraocular Movements: Extraocular movements intact.  Cardiovascular:     Rate and Rhythm: Normal rate and regular rhythm.     Heart sounds: Normal heart sounds.  Pulmonary:     Effort: Pulmonary effort is normal.     Breath sounds: Normal breath sounds.  Musculoskeletal:     Cervical back: Normal range of motion.  Skin:    General: Skin is warm.  Neurological:     General: No focal deficit present.     Mental Status: She is alert.   Psychiatric:        Mood and Affect: Mood normal.        Behavior: Behavior normal.         Assessment And Plan:     1. Essential hypertension Comments: Chronic, fair control. Goal BP<130/80, optimal range <120/80. She is encouraged to limit her salt intake. No need for med change today.  - CMP14+EGFR  2. Postablative hypothyroidism Comments: Chronic, I will check thyroid panel today and adjust meds as needed.  - TSH - T4, Free  3. Pure hypercholesterolemia Comments: WE discussed ASCVD risk and cardiac calcium score. She would like to wait until Jan 2024 after she starts her new job.   4. Postmenopause Comments: I will refer her to GYN for their evaluation.  - Ambulatory referral to Gynecology  5. Other abnormal glucose Comments: Her a1c has been elevated in the past, I will recheck an a1 today.  Importance of dietary/exercise compliance was d/w patient.  - Hemoglobin A1c  6. Vitamin B12 deficiency Comments: I will check vitamin B12 level today.  - Vitamin B12  7. Class 1 obesity due to excess calories with serious comorbidity and body mass index (BMI) of 32.0 to 32.9 in adult Comments: She is encouraged to aim for at least 150 minutes of exercise per week, while striving fo rBMI<30 to decrease cardiac risk.   8. Immunization due - Flu Vaccine QUAD 6+ mos PF IM (Fluarix Quad PF)   Patient was given opportunity to ask questions. Patient verbalized understanding of the plan and was able to repeat key elements of the plan. All questions were answered to their satisfaction.   I, Maximino Greenland, MD, have reviewed all documentation for this visit. The documentation on 03/05/22 for the exam, diagnosis, procedures, and orders are all accurate and complete.   IF YOU HAVE BEEN REFERRED TO A SPECIALIST, IT MAY TAKE 1-2 WEEKS TO SCHEDULE/PROCESS THE REFERRAL. IF YOU HAVE NOT HEARD FROM US/SPECIALIST IN TWO WEEKS, PLEASE GIVE [place] A CALL AT [phone].   THE PATIENT IS  ENCOURAGED TO PRACTICE SOCIAL DISTANCING DUE TO THE COVID-19 PANDEMIC.

## 2022-03-05 NOTE — Patient Instructions (Addendum)
The 10-year ASCVD risk score (Arnett DK, et al., 2019) is: 8.6%   Values used to calculate the score:     Age: 66 years     Sex: Female     Is Non-Hispanic African American: Yes     Diabetic: No     Tobacco smoker: No     Systolic Blood Pressure: 381 mmHg     Is BP treated: Yes     HDL Cholesterol: 65 mg/dL     Total Cholesterol: 193 mg/dL   Coronary Calcium Scan A coronary calcium scan is an imaging test used to look for deposits of plaque in the inner lining of the blood vessels of the heart (coronary arteries). Plaque is made up of calcium, protein, and fatty substances. These deposits of plaque can partly clog and narrow the coronary arteries without producing any symptoms or warning signs. This puts a person at risk for a heart attack. A coronary calcium scan is performed using a computed tomography (CT) scanner machine without using a dye (contrast). This test is recommended for people who are at moderate risk for heart disease. The test can find plaque deposits before symptoms develop. Tell a health care provider about: Any allergies you have. All medicines you are taking, including vitamins, herbs, eye drops, creams, and over-the-counter medicines. Any problems you or family members have had with anesthetic medicines. Any bleeding problems you have. Any surgeries you have had. Any medical conditions you have. Whether you are pregnant or may be pregnant. What are the risks? Generally, this is a safe procedure. However, problems may occur, including: Harm to a pregnant woman and her unborn baby. This test involves the use of radiation. Radiation exposure can be dangerous to a pregnant woman and her unborn baby. If you are pregnant or think you may be pregnant, you should not have this procedure done. A slight increase in the risk of cancer. This is because of the radiation involved in the test. The amount of radiation from one test is similar to the amount of radiation you are  naturally exposed to over one year. What happens before the procedure? Ask your health care provider for any specific instructions on how to prepare for this procedure. You may be asked to avoid products that contain caffeine, tobacco, or nicotine for 4 hours before the procedure. What happens during the procedure?  You will undress and remove any jewelry from your neck or chest. You may need to remove hearing aides and dentures. Women may need to remove their bras. You will put on a hospital gown. Sticky electrodes will be placed on your chest. The electrodes will be connected to an electrocardiogram (ECG) machine to record a tracing of the electrical activity of your heart. You will lie down on your back on a curved bed that is attached to the [place]. You may be given medicine to slow down your heart rate so that clear pictures can be created. You will be moved into the CT scanner, and the CT scanner will take pictures of your heart. During this time, you will be asked to lie still and hold your breath for 10-20 seconds at a time while each picture of your heart is being taken. The procedure may vary among health care providers and hospitals. What can I expect after the procedure? You can return to your normal activities. It is up to you to get the results of your procedure. Ask your health care provider, or the department that is  doing the procedure, when your results will be ready. Summary A coronary calcium scan is an imaging test used to look for deposits of plaque in the inner lining of the blood vessels of the heart. Plaque is made up of calcium, protein, and fatty substances. A coronary calcium scan is performed using a CT scanner machine without contrast. Generally, this is a safe procedure. Tell your health care provider if you are pregnant or may be pregnant. Ask your health care provider for any specific instructions on how to prepare for this procedure. You can return to your  normal activities after the scan is done. This information is not intended to replace advice given to you by your health care provider. Make sure you discuss any questions you have with your health care provider. Document Revised: 04/21/2021 Document Reviewed: 04/21/2021 Elsevier Patient Education  [place].

## 2022-03-06 LAB — CMP14+EGFR
ALT: 10 IU/L (ref 0–32)
AST: 17 IU/L (ref 0–40)
Albumin/Globulin Ratio: 1 — ABNORMAL LOW (ref 1.2–2.2)
Albumin: 4.1 g/dL (ref 3.9–4.9)
Alkaline Phosphatase: 66 IU/L (ref 44–121)
BUN/Creatinine Ratio: 13 (ref 12–28)
BUN: 13 mg/dL (ref 8–27)
Bilirubin Total: 0.4 mg/dL (ref 0.0–1.2)
CO2: 27 mmol/L (ref 20–29)
Calcium: 9.4 mg/dL (ref 8.7–10.3)
Chloride: 99 mmol/L (ref 96–106)
Creatinine, Ser: 1.01 mg/dL — ABNORMAL HIGH (ref 0.57–1.00)
Globulin, Total: 4.3 g/dL (ref 1.5–4.5)
Glucose: 147 mg/dL — ABNORMAL HIGH (ref 70–99)
Potassium: 4 mmol/L (ref 3.5–5.2)
Sodium: 140 mmol/L (ref 134–144)
Total Protein: 8.4 g/dL (ref 6.0–8.5)
eGFR: 62 mL/min/{1.73_m2} (ref >59)

## 2022-03-06 LAB — HEMOGLOBIN A1C
Est. average glucose Bld gHb Est-mCnc: 126 mg/dL
Hgb A1c MFr Bld: 6 % — ABNORMAL HIGH (ref 4.8–5.6)

## 2022-03-06 LAB — TSH: TSH: 2.91 u[IU]/mL (ref 0.450–4.500)

## 2022-03-06 LAB — VITAMIN B12: Vitamin B-12: 439 pg/mL (ref 232–1245)

## 2022-03-06 LAB — T4, FREE: Free T4: 1.32 ng/dL (ref 0.82–1.77)

## 2022-04-01 ENCOUNTER — Other Ambulatory Visit: Payer: Self-pay

## 2022-04-01 MED ORDER — VALSARTAN-HYDROCHLOROTHIAZIDE 160-12.5 MG PO TABS: 1.0000 | ORAL_TABLET | Freq: Every day | ORAL | 0 refills | Status: DC

## 2022-04-02 ENCOUNTER — Encounter: Payer: Self-pay | Admitting: Internal Medicine

## 2022-05-06 ENCOUNTER — Encounter: Payer: Self-pay | Admitting: Internal Medicine

## 2022-06-30 ENCOUNTER — Encounter: Payer: Self-pay | Admitting: Internal Medicine

## 2022-07-02 ENCOUNTER — Encounter: Payer: Self-pay | Admitting: Internal Medicine

## 2022-07-04 ENCOUNTER — Ambulatory Visit: Payer: Self-pay

## 2022-07-04 ENCOUNTER — Ambulatory Visit: Payer: Medicare PPO

## 2022-07-04 DIAGNOSIS — E538 Deficiency of other specified B group vitamins: Secondary | ICD-10-CM

## 2022-07-11 ENCOUNTER — Ambulatory Visit: Payer: Self-pay

## 2022-07-23 ENCOUNTER — Other Ambulatory Visit: Payer: Self-pay | Admitting: Internal Medicine

## 2022-07-23 DIAGNOSIS — Z1231 Encounter for screening mammogram for malignant neoplasm of breast: Secondary | ICD-10-CM

## 2022-08-14 ENCOUNTER — Encounter: Payer: Self-pay | Admitting: Internal Medicine

## 2022-08-15 ENCOUNTER — Ambulatory Visit: Payer: Medicare PPO

## 2022-09-11 ENCOUNTER — Encounter: Payer: Self-pay | Admitting: Internal Medicine

## 2022-09-11 ENCOUNTER — Encounter: Payer: 59 | Admitting: Internal Medicine

## 2022-09-22 ENCOUNTER — Ambulatory Visit
Admission: RE | Admit: 2022-09-22 | Discharge: 2022-09-22 | Disposition: A | Discharge status: Home / Self Care | Payer: BLUE CROSS/BLUE SHIELD | Source: Ambulatory Visit | Attending: Internal Medicine | Admitting: Internal Medicine

## 2022-09-22 DIAGNOSIS — Z1231 Encounter for screening mammogram for malignant neoplasm of breast: Secondary | ICD-10-CM

## 2022-10-06 DIAGNOSIS — H401133 Primary open-angle glaucoma, bilateral, severe stage: Secondary | ICD-10-CM | POA: Diagnosis not present

## 2022-10-07 ENCOUNTER — Encounter: Payer: Self-pay | Admitting: Internal Medicine

## 2022-10-10 ENCOUNTER — Ambulatory Visit: Payer: Medicare PPO

## 2022-11-24 ENCOUNTER — Encounter: Payer: Self-pay | Admitting: Internal Medicine

## 2022-12-01 DIAGNOSIS — H401133 Primary open-angle glaucoma, bilateral, severe stage: Secondary | ICD-10-CM | POA: Diagnosis not present

## 2022-12-05 DIAGNOSIS — R7301 Impaired fasting glucose: Secondary | ICD-10-CM | POA: Diagnosis not present

## 2022-12-05 DIAGNOSIS — E785 Hyperlipidemia, unspecified: Secondary | ICD-10-CM | POA: Diagnosis not present

## 2022-12-05 DIAGNOSIS — E559 Vitamin D deficiency, unspecified: Secondary | ICD-10-CM | POA: Diagnosis not present

## 2022-12-05 DIAGNOSIS — E890 Postprocedural hypothyroidism: Secondary | ICD-10-CM | POA: Diagnosis not present

## 2022-12-05 LAB — COMPREHENSIVE METABOLIC PANEL
Albumin: 4 (ref 3.5–5.0)
Calcium: 9.2 (ref 8.7–10.7)
Globulin: 4.2
eGFR: 68

## 2022-12-05 LAB — HEPATIC FUNCTION PANEL
ALT: 10 U/L (ref 7–35)
AST: 19 (ref 13–35)
Alkaline Phosphatase: 73 (ref 25–125)
Bilirubin, Total: 0.4

## 2022-12-05 LAB — BASIC METABOLIC PANEL
BUN: 14 (ref 4–21)
CO2: 24 — AB (ref 13–22)
Chloride: 99 (ref 99–108)
Creatinine: 0.9 (ref 0.5–1.1)
Glucose: 117
Potassium: 3.6 mEq/L (ref 3.5–5.1)
Sodium: 139 (ref 137–147)

## 2022-12-05 LAB — LIPID PANEL
Cholesterol: 189 (ref 0–200)
HDL: 64 (ref 35–70)
LDL Cholesterol: 105
Triglycerides: 112 (ref 40–160)

## 2022-12-05 LAB — HEMOGLOBIN A1C: Hemoglobin A1C: 5.8

## 2022-12-05 LAB — VITAMIN D 25 HYDROXY (VIT D DEFICIENCY, FRACTURES): Vit D, 25-Hydroxy: 29.7

## 2022-12-05 LAB — TSH: TSH: 2.16 (ref 0.41–5.90)

## 2022-12-11 ENCOUNTER — Encounter: Payer: Medicare PPO | Admitting: Internal Medicine

## 2022-12-12 DIAGNOSIS — I10 Essential (primary) hypertension: Secondary | ICD-10-CM | POA: Diagnosis not present

## 2022-12-12 DIAGNOSIS — E559 Vitamin D deficiency, unspecified: Secondary | ICD-10-CM | POA: Diagnosis not present

## 2022-12-12 DIAGNOSIS — R7301 Impaired fasting glucose: Secondary | ICD-10-CM | POA: Diagnosis not present

## 2022-12-12 DIAGNOSIS — E785 Hyperlipidemia, unspecified: Secondary | ICD-10-CM | POA: Diagnosis not present

## 2022-12-12 DIAGNOSIS — E890 Postprocedural hypothyroidism: Secondary | ICD-10-CM | POA: Diagnosis not present

## 2023-01-20 ENCOUNTER — Encounter: Payer: Self-pay | Admitting: Internal Medicine

## 2023-01-20 ENCOUNTER — Ambulatory Visit (INDEPENDENT_AMBULATORY_CARE_PROVIDER_SITE_OTHER): Payer: Medicare PPO | Admitting: Internal Medicine

## 2023-01-20 VITALS — BP 132/78 | HR 104 | Temp 98.1°F | Ht 67.4 in | Wt 203.0 lb

## 2023-01-20 DIAGNOSIS — E6609 Other obesity due to excess calories: Secondary | ICD-10-CM | POA: Diagnosis not present

## 2023-01-20 DIAGNOSIS — Z0000 Encounter for general adult medical examination without abnormal findings: Secondary | ICD-10-CM | POA: Diagnosis not present

## 2023-01-20 DIAGNOSIS — E538 Deficiency of other specified B group vitamins: Secondary | ICD-10-CM

## 2023-01-20 DIAGNOSIS — E2839 Other primary ovarian failure: Secondary | ICD-10-CM

## 2023-01-20 DIAGNOSIS — Z23 Encounter for immunization: Secondary | ICD-10-CM | POA: Diagnosis not present

## 2023-01-20 DIAGNOSIS — R82998 Other abnormal findings in urine: Secondary | ICD-10-CM

## 2023-01-20 DIAGNOSIS — E890 Postprocedural hypothyroidism: Secondary | ICD-10-CM

## 2023-01-20 DIAGNOSIS — Z6831 Body mass index (BMI) 31.0-31.9, adult: Secondary | ICD-10-CM

## 2023-01-20 DIAGNOSIS — I10 Essential (primary) hypertension: Secondary | ICD-10-CM

## 2023-01-20 DIAGNOSIS — E7800 Pure hypercholesterolemia, unspecified: Secondary | ICD-10-CM | POA: Diagnosis not present

## 2023-01-20 DIAGNOSIS — Z780 Asymptomatic menopausal state: Secondary | ICD-10-CM

## 2023-01-20 MED ORDER — CYANOCOBALAMIN 1000 MCG/ML IJ SOLN
1000.0000 ug | Freq: Once | INTRAMUSCULAR | Status: AC
Start: 2023-01-20 — End: 2023-01-20
  Administered 2023-01-20: 1000 ug via INTRAMUSCULAR

## 2023-01-20 MED ORDER — HYDROXYZINE HCL 10 MG PO TABS: 10.0000 mg | ORAL_TABLET | Freq: Three times a day (TID) | ORAL | 0 refills | Status: AC | PRN

## 2023-01-20 NOTE — Progress Notes (Signed)
I,Jameka J Llittleton, CMA,acting as a Neurosurgeon for Gwynneth Aliment, MD.,have documented all relevant documentation on the behalf of Gwynneth Aliment, MD,as directed by  Gwynneth Aliment, MD while in the presence of Gwynneth Aliment, MD.  Subjective:    Patient ID: Julia Richards , female    DOB: July 18, 1955 , 67 y.o.   MRN: [id number]  Chief Complaint  Patient presents with   Annual Exam   Hypertension    HPI  She is here today for a full physical examination.She is going to be followed by [facility] for her for her GYN care.  She reports compliance with meds.  She has no specific concerns or complaints at this time.   Hypertension This is a chronic problem. The current episode started more than 1 year ago. The problem has been gradually improving since onset. The problem is controlled. Pertinent negatives include no blurred vision. Risk factors for coronary artery disease include obesity, sedentary lifestyle and post-menopausal state. Past treatments include angiotensin blockers and diuretics.     Past Medical History:  Diagnosis Date   Abnormal pap    Allergy    Anemia    Bacterial vaginosis    Cervicitis    Colonic polyp    Endometriosis    Fibroids    Glaucoma    Hypertension    Hypothyroidism    Vulvitis      Family History  Problem Relation Age of Onset   Hypertension Mother    Cancer Mother        stomach   Kidney disease Father    Hypertension Sister    Diabetes Maternal Aunt    Diabetes Maternal Uncle    Stroke Maternal Uncle    Breast cancer Neg Hx      Current Outpatient Medications:    bimatoprost (LUMIGAN) 0.01 % SOLN, 1 drop at bedtime., Disp: , Rfl:    brimonidine (ALPHAGAN) 0.2 % ophthalmic solution, Place 1 drop into the right eye 3 (three) times daily., Disp: , Rfl:    calcium carbonate (TUMS EX) 750 MG chewable tablet, Chew 1 tablet by mouth daily., Disp: , Rfl:    Cholecalciferol (VITAMIN D3) 125 MCG (5000 UT) TBDP, Take by mouth., Disp: , Rfl:     dorzolamide-timolol (COSOPT) 22.3-6.8 MG/ML ophthalmic solution, 1 drop 2 (two) times daily., Disp: , Rfl:    hydrOXYzine (ATARAX) 10 MG tablet, Take 1 tablet (10 mg total) by mouth 3 (three) times daily as needed., Disp: 30 tablet, Rfl: 0   Levothyroxine Sodium 75 MCG CAPS, Take 1 capsule (75 mcg total) by mouth daily before breakfast., Disp: 90 capsule, Rfl: 0   Netarsudil Dimesylate (RHOPRESSA) 0.02 % SOLN, 1 drop into affected eye in the evening, Disp: , Rfl:    pilocarpine (PILOCAR) 4 % ophthalmic solution, 1 drop 4 (four) times daily., Disp: , Rfl:    valsartan-hydrochlorothiazide (DIOVAN-HCT) 160-12.5 MG tablet, Take 1 tablet by mouth daily., Disp: 90 tablet, Rfl: 0   Allergies  Allergen Reactions   Amoxicillin    Codeine Itching   Penicillins Hives and Itching   Tetracyclines & Related       The patient states she uses post menopausal status for birth control. No LMP recorded. Patient is postmenopausal.. Negative for Dysmenorrhea. Negative for: breast discharge, breast lump(s), breast pain and breast self exam. Associated symptoms include abnormal vaginal bleeding. Pertinent negatives include abnormal bleeding (hematology), anxiety, decreased libido, depression, difficulty falling sleep, dyspareunia, history of infertility, nocturia, sexual dysfunction, sleep  disturbances, urinary incontinence, urinary urgency, vaginal discharge and vaginal itching. Diet regular.The patient states her exercise level is  intermittent.  . The patient's tobacco use is:  Social History   Tobacco Use  Smoking Status Never  Smokeless Tobacco Never  . She has been exposed to passive smoke. The patient's alcohol use is:  Social History   Substance and Sexual Activity  Alcohol Use Yes   Review of Systems  Constitutional: Negative.   HENT: Negative.    Eyes: Negative.  Negative for blurred vision.  Respiratory: Negative.    Cardiovascular: Negative.   Gastrointestinal: Negative.   Endocrine:  Negative.   Genitourinary: Negative.   Musculoskeletal: Negative.   Skin: Negative.   Allergic/Immunologic: Negative.   Neurological: Negative.   Hematological: Negative.   Psychiatric/Behavioral: Negative.       Today's Vitals   01/20/23 1124  BP: 132/78  Pulse: (!) 104  Temp: 98.1 F (36.7 C)  Weight: 203 lb (92.1 kg)  Height: 5' 7.4" (1.712 m)  PainSc: 0-No pain   Body mass index is 31.42 kg/m.  Wt Readings from Last 3 Encounters:  01/20/23 203 lb (92.1 kg)  03/05/22 209 lb 3.2 oz (94.9 kg)  09/03/21 206 lb 6.4 oz (93.6 kg)    The 10-year ASCVD risk score (Arnett DK, et al., 2019) is: 9%*   Values used to calculate the score:     Age: 89 years     Sex: Female     Is Non-Hispanic African American: Yes     Diabetic: No     Tobacco smoker: No     Systolic Blood Pressure: 132 mmHg     Is BP treated: Yes     HDL Cholesterol: 64 mg/dL*     Total Cholesterol: 189 mg/dL*     * - Cholesterol units were assumed for this score calculation   Objective:  Physical Exam Vitals and nursing note reviewed.  Constitutional:      Appearance: Normal appearance. She is obese.  HENT:     Head: Normocephalic and atraumatic.     Right Ear: Tympanic membrane, ear canal and external ear normal.     Left Ear: Tympanic membrane, ear canal and external ear normal.     Nose: Nose normal.     Mouth/Throat:     Mouth: Mucous membranes are moist.     Pharynx: Oropharynx is clear.  Eyes:     Extraocular Movements: Extraocular movements intact.     Conjunctiva/sclera: Conjunctivae normal.     Pupils: Pupils are equal, round, and reactive to light.  Cardiovascular:     Rate and Rhythm: Normal rate and regular rhythm.     Pulses: Normal pulses.     Heart sounds: Normal heart sounds.  Pulmonary:     Effort: Pulmonary effort is normal.     Breath sounds: Normal breath sounds.  Chest:  Breasts:    Tanner Score is 5.     Right: Normal.     Left: Normal.  Abdominal:     General: Bowel  sounds are normal.     Palpations: Abdomen is soft.  Genitourinary:    Comments: deferred Musculoskeletal:        General: Normal range of motion.     Cervical back: Normal range of motion and neck supple.  Skin:    General: Skin is warm and dry.  Neurological:     General: No focal deficit present.     Mental Status: She is alert and oriented  to person, place, and time.  Psychiatric:        Mood and Affect: Mood normal.        Behavior: Behavior normal.         Assessment And Plan:     Annual physical exam Assessment & Plan: A full exam was performed.  Importance of monthly self breast exams was discussed with the patient.  She is advised to get 30-45 minutes of regular exercise, no less than four to five days per week. Both weight-bearing and aerobic exercises are recommended.  She is advised to follow a healthy diet with at least six fruits/veggies per day, decrease intake of red meat and other saturated fats and to increase fish intake to twice weekly.  Meats/fish should not be fried -- baked, boiled or broiled is preferable. It is also important to cut back on your sugar intake.  Be sure to read labels - try to avoid anything with added sugar, high fructose corn syrup or other sweeteners.  If you must use a sweetener, you can try stevia or monkfruit.  It is also important to avoid artificially sweetened foods/beverages and diet drinks. Lastly, wear SPF 50 sunscreen on exposed skin and when in direct sunlight for an extended period of time.  Be sure to avoid fast food restaurants and aim for at least 60 ounces of water daily.       Essential hypertension Assessment & Plan: Chronic, fair control. Goal BP<120/80.  EKG performed, Sinus tachycardia. She will continue with valsartan/hct 160/12.5mg  daily for now. May need to consider discontinuing hydrochlorothiazide due to tachycardia. Encouraged to stay well hydrated and to follow a low sodium diet. She will f/u in six months for  re-evaluation.   Orders: -     EKG 12-Lead -     Amb Referral To Provider Referral Exercise Program ([facility].R.E[facility]P) -     POCT urinalysis dipstick -     Microalbumin / creatinine urine ratio  Postablative hypothyroidism Assessment & Plan: Chronic, currently taking levothyroxine daily. I will check thyroid panel and adjust meds as needed. Reminded to take thyroid meds upon awakening and Richards at least 30 minutes to eat breakfast and take morning meds.    Pure hypercholesterolemia Assessment & Plan: ASCVD risk score is 9%. Will consider use of cardiac calcium scoring to further evaluation cardiac risk.   Orders: -     Amb Referral To Provider Referral Exercise Program (P.R.E.P)  Vitamin B12 deficiency -     Vitamin B12 -     Cyanocobalamin  Estrogen deficiency -     DG Bone Density; Future  Leukocytes in urine -     Urine Culture  Class 1 obesity due to excess calories with serious comorbidity and body mass index (BMI) of 31.0 to 31.9 in adult Assessment & Plan: She is encouraged to strive for BMI less than 30 to decrease cardiac risk. Advised to aim for at least 150 minutes of exercise per week.   Orders: -     Amb Referral To Provider Referral Exercise Program ([facility].R.E[facility])  Immunization due -     Pneumococcal conjugate vaccine 20-valent  Other orders -     hydrOXYzine HCl; Take 1 tablet (10 mg total) by mouth 3 (three) times daily as needed.  Dispense: 30 tablet; Refill: 0     Return for 1 year physical. Patient was given opportunity to ask questions. Patient verbalized understanding of the plan and was able to repeat key elements of the plan.  All questions were answered to their satisfaction.   I, Gwynneth Aliment, MD, have reviewed all documentation for this visit. The documentation on 01/20/23 for the exam, diagnosis, procedures, and orders are all accurate and complete.

## 2023-01-21 DIAGNOSIS — I10 Essential (primary) hypertension: Secondary | ICD-10-CM | POA: Diagnosis not present

## 2023-01-21 DIAGNOSIS — R82998 Other abnormal findings in urine: Secondary | ICD-10-CM | POA: Diagnosis not present

## 2023-01-21 LAB — POCT URINALYSIS DIPSTICK
Bilirubin, UA: NEGATIVE
Blood, UA: NEGATIVE
Glucose, UA: NEGATIVE
Ketones, UA: NEGATIVE
Nitrite, UA: POSITIVE
Protein, UA: NEGATIVE
Spec Grav, UA: 1.01 (ref 1.010–1.025)
Urobilinogen, UA: 0.2 E.U./dL
pH, UA: 6.5 (ref 5.0–8.0)

## 2023-01-21 LAB — VITAMIN B12: Vitamin B-12: 321 pg/mL (ref 232–1245)

## 2023-01-22 ENCOUNTER — Telehealth: Payer: Self-pay

## 2023-01-22 LAB — MICROALBUMIN / CREATININE URINE RATIO
Creatinine, Urine: 69.5 mg/dL
Microalb/Creat Ratio: 7 mg/g creat (ref 0–29)
Microalbumin, Urine: 4.8 ug/mL

## 2023-01-22 NOTE — Telephone Encounter (Signed)
VMT pt requesting call back to discuss referral to [facility] 

## 2023-01-23 ENCOUNTER — Encounter: Payer: Self-pay | Admitting: Internal Medicine

## 2023-01-24 LAB — URINE CULTURE

## 2023-01-25 ENCOUNTER — Other Ambulatory Visit: Payer: Self-pay | Admitting: Internal Medicine

## 2023-01-25 MED ORDER — NITROFURANTOIN MONOHYD MACRO 100 MG PO CAPS: 100.0000 mg | ORAL_CAPSULE | Freq: Two times a day (BID) | ORAL | 0 refills | Status: AC

## 2023-02-01 DIAGNOSIS — E6609 Other obesity due to excess calories: Secondary | ICD-10-CM | POA: Insufficient documentation

## 2023-02-01 NOTE — Assessment & Plan Note (Signed)
ASCVD risk score is 9%. Will consider use of cardiac calcium scoring to further evaluation cardiac risk.

## 2023-02-01 NOTE — Assessment & Plan Note (Signed)
Chronic, fair control. Goal BP<120/80.  EKG performed, Sinus tachycardia. She will continue with valsartan/hct 160/12.5mg  daily for now. May need to consider discontinuing hydrochlorothiazide due to tachycardia. Encouraged to stay well hydrated and to follow a low sodium diet. She will f/u in six months for re-evaluation.

## 2023-02-01 NOTE — Assessment & Plan Note (Signed)

## 2023-02-01 NOTE — Assessment & Plan Note (Signed)
Chronic, currently taking levothyroxine daily. I will check thyroid panel and adjust meds as needed. Reminded to take thyroid meds upon awakening and wait at least 30 minutes to eat breakfast and take morning meds.

## 2023-02-01 NOTE — Assessment & Plan Note (Signed)
She is encouraged to strive for BMI less than 30 to decrease cardiac risk. Advised to aim for at least 150 minutes of exercise per week.  

## 2023-03-16 ENCOUNTER — Other Ambulatory Visit: Payer: Self-pay | Admitting: Internal Medicine

## 2023-03-18 ENCOUNTER — Encounter: Payer: Self-pay | Admitting: Internal Medicine

## 2023-03-20 ENCOUNTER — Ambulatory Visit: Payer: Medicare PPO

## 2023-03-20 VITALS — Temp 98.5°F | Ht 67.0 in | Wt 203.0 lb

## 2023-03-20 MED ORDER — CYANOCOBALAMIN 1000 MCG/ML IJ SOLN
1000.0000 ug | Freq: Once | INTRAMUSCULAR | Status: DC
Start: 2023-03-20 — End: 2023-03-20

## 2023-03-20 NOTE — Progress Notes (Deleted)
Patient presents today for a b12 shot.

## 2023-03-30 DIAGNOSIS — H401133 Primary open-angle glaucoma, bilateral, severe stage: Secondary | ICD-10-CM | POA: Diagnosis not present

## 2023-04-01 DIAGNOSIS — H401133 Primary open-angle glaucoma, bilateral, severe stage: Secondary | ICD-10-CM | POA: Diagnosis not present

## 2023-04-16 ENCOUNTER — Encounter: Payer: Self-pay | Admitting: Internal Medicine

## 2023-06-12 ENCOUNTER — Other Ambulatory Visit: Payer: Self-pay | Admitting: Internal Medicine

## 2023-06-16 ENCOUNTER — Encounter: Payer: Self-pay | Admitting: Internal Medicine

## 2023-07-06 DIAGNOSIS — H401133 Primary open-angle glaucoma, bilateral, severe stage: Secondary | ICD-10-CM | POA: Diagnosis not present

## 2023-07-13 DIAGNOSIS — E559 Vitamin D deficiency, unspecified: Secondary | ICD-10-CM | POA: Diagnosis not present

## 2023-07-13 DIAGNOSIS — E785 Hyperlipidemia, unspecified: Secondary | ICD-10-CM | POA: Diagnosis not present

## 2023-07-13 DIAGNOSIS — R7301 Impaired fasting glucose: Secondary | ICD-10-CM | POA: Diagnosis not present

## 2023-07-13 DIAGNOSIS — E890 Postprocedural hypothyroidism: Secondary | ICD-10-CM | POA: Diagnosis not present

## 2023-07-20 DIAGNOSIS — E559 Vitamin D deficiency, unspecified: Secondary | ICD-10-CM | POA: Diagnosis not present

## 2023-07-20 DIAGNOSIS — E890 Postprocedural hypothyroidism: Secondary | ICD-10-CM | POA: Diagnosis not present

## 2023-07-20 DIAGNOSIS — I10 Essential (primary) hypertension: Secondary | ICD-10-CM | POA: Diagnosis not present

## 2023-07-20 DIAGNOSIS — E785 Hyperlipidemia, unspecified: Secondary | ICD-10-CM | POA: Diagnosis not present

## 2023-07-20 DIAGNOSIS — R7301 Impaired fasting glucose: Secondary | ICD-10-CM | POA: Diagnosis not present

## 2023-08-03 ENCOUNTER — Encounter: Payer: Self-pay | Admitting: Internal Medicine

## 2023-08-31 ENCOUNTER — Other Ambulatory Visit: Payer: Self-pay | Admitting: Internal Medicine

## 2023-08-31 DIAGNOSIS — Z1231 Encounter for screening mammogram for malignant neoplasm of breast: Secondary | ICD-10-CM

## 2023-09-02 ENCOUNTER — Other Ambulatory Visit: Payer: Self-pay | Admitting: Internal Medicine

## 2023-09-07 ENCOUNTER — Encounter: Payer: Self-pay | Admitting: Internal Medicine

## 2023-09-08 ENCOUNTER — Ambulatory Visit: Payer: Self-pay

## 2023-09-09 DIAGNOSIS — Z124 Encounter for screening for malignant neoplasm of cervix: Secondary | ICD-10-CM | POA: Diagnosis not present

## 2023-09-09 DIAGNOSIS — Z13820 Encounter for screening for osteoporosis: Secondary | ICD-10-CM | POA: Diagnosis not present

## 2023-09-09 DIAGNOSIS — Z1339 Encounter for screening examination for other mental health and behavioral disorders: Secondary | ICD-10-CM | POA: Diagnosis not present

## 2023-09-09 DIAGNOSIS — Z1211 Encounter for screening for malignant neoplasm of colon: Secondary | ICD-10-CM | POA: Diagnosis not present

## 2023-09-09 DIAGNOSIS — Z01419 Encounter for gynecological examination (general) (routine) without abnormal findings: Secondary | ICD-10-CM | POA: Diagnosis not present

## 2023-09-09 DIAGNOSIS — Z1239 Encounter for other screening for malignant neoplasm of breast: Secondary | ICD-10-CM | POA: Diagnosis not present

## 2023-09-28 DIAGNOSIS — H401133 Primary open-angle glaucoma, bilateral, severe stage: Secondary | ICD-10-CM | POA: Diagnosis not present

## 2023-10-13 ENCOUNTER — Other Ambulatory Visit: Payer: Medicare PPO

## 2023-10-14 ENCOUNTER — Ambulatory Visit

## 2023-11-10 ENCOUNTER — Ambulatory Visit

## 2023-11-10 ENCOUNTER — Encounter: Payer: Self-pay | Admitting: Internal Medicine

## 2023-11-16 ENCOUNTER — Other Ambulatory Visit: Payer: Self-pay | Admitting: Internal Medicine

## 2023-11-16 ENCOUNTER — Encounter: Payer: Self-pay | Admitting: Internal Medicine

## 2023-11-16 DIAGNOSIS — E2839 Other primary ovarian failure: Secondary | ICD-10-CM

## 2023-11-17 DIAGNOSIS — E890 Postprocedural hypothyroidism: Secondary | ICD-10-CM | POA: Diagnosis not present

## 2023-11-17 DIAGNOSIS — E785 Hyperlipidemia, unspecified: Secondary | ICD-10-CM | POA: Diagnosis not present

## 2023-11-17 DIAGNOSIS — R7301 Impaired fasting glucose: Secondary | ICD-10-CM | POA: Diagnosis not present

## 2023-11-19 DIAGNOSIS — H401133 Primary open-angle glaucoma, bilateral, severe stage: Secondary | ICD-10-CM | POA: Diagnosis not present

## 2023-11-24 DIAGNOSIS — R7301 Impaired fasting glucose: Secondary | ICD-10-CM | POA: Diagnosis not present

## 2023-11-24 DIAGNOSIS — E785 Hyperlipidemia, unspecified: Secondary | ICD-10-CM | POA: Diagnosis not present

## 2023-11-24 DIAGNOSIS — E559 Vitamin D deficiency, unspecified: Secondary | ICD-10-CM | POA: Diagnosis not present

## 2023-11-24 DIAGNOSIS — I10 Essential (primary) hypertension: Secondary | ICD-10-CM | POA: Diagnosis not present

## 2023-11-24 DIAGNOSIS — E890 Postprocedural hypothyroidism: Secondary | ICD-10-CM | POA: Diagnosis not present

## 2023-12-03 ENCOUNTER — Other Ambulatory Visit: Payer: Self-pay | Admitting: Internal Medicine

## 2023-12-28 ENCOUNTER — Encounter: Payer: Self-pay | Admitting: Internal Medicine

## 2024-01-08 ENCOUNTER — Encounter: Payer: Self-pay | Admitting: Internal Medicine

## 2024-01-12 ENCOUNTER — Ambulatory Visit

## 2024-01-21 DIAGNOSIS — H401133 Primary open-angle glaucoma, bilateral, severe stage: Secondary | ICD-10-CM | POA: Diagnosis not present

## 2024-03-03 ENCOUNTER — Encounter: Payer: Self-pay | Admitting: Internal Medicine

## 2024-03-04 ENCOUNTER — Other Ambulatory Visit: Payer: Self-pay | Admitting: Internal Medicine

## 2024-03-04 ENCOUNTER — Ambulatory Visit
Admission: RE | Admit: 2024-03-04 | Discharge: 2024-03-04 | Disposition: A | Discharge status: Home / Self Care | Source: Ambulatory Visit | Attending: Internal Medicine | Admitting: Internal Medicine

## 2024-03-04 DIAGNOSIS — Z1231 Encounter for screening mammogram for malignant neoplasm of breast: Secondary | ICD-10-CM

## 2024-04-11 ENCOUNTER — Encounter: Payer: Self-pay | Admitting: Internal Medicine

## 2024-06-04 ENCOUNTER — Other Ambulatory Visit: Payer: Self-pay | Admitting: Internal Medicine

## 2024-06-09 ENCOUNTER — Encounter: Admitting: Internal Medicine

## 2024-06-09 NOTE — Patient Instructions (Incomplete)

## 2024-06-10 ENCOUNTER — Encounter: Payer: Self-pay | Admitting: Internal Medicine

## 2024-07-15 ENCOUNTER — Encounter: Payer: Self-pay | Admitting: Family Medicine

## 2024-07-18 ENCOUNTER — Ambulatory Visit (HOSPITAL_BASED_OUTPATIENT_CLINIC_OR_DEPARTMENT_OTHER)
